# Patient Record
Sex: Female | Born: 1985 | Race: White | Hispanic: No | Marital: Single | Smoking: Former smoker
Health system: Southern US, Community
[De-identification: ages and names within clinical notes are randomized; demographics above are authoritative.]

## PROBLEM LIST (undated history)

## (undated) DIAGNOSIS — F329 Major depressive disorder, single episode, unspecified: Secondary | ICD-10-CM

## (undated) DIAGNOSIS — F32A Depression, unspecified: Secondary | ICD-10-CM

## (undated) DIAGNOSIS — K219 Gastro-esophageal reflux disease without esophagitis: Secondary | ICD-10-CM

## (undated) DIAGNOSIS — L700 Acne vulgaris: Secondary | ICD-10-CM

## (undated) DIAGNOSIS — R87629 Unspecified abnormal cytological findings in specimens from vagina: Secondary | ICD-10-CM

## (undated) DIAGNOSIS — F419 Anxiety disorder, unspecified: Secondary | ICD-10-CM

## (undated) HISTORY — DX: Unspecified abnormal cytological findings in specimens from vagina: R87.629

## (undated) HISTORY — DX: Anxiety disorder, unspecified: F41.9

## (undated) HISTORY — PX: OTHER SURGICAL HISTORY: SHX169

## (undated) HISTORY — DX: Depression, unspecified: F32.A

## (undated) HISTORY — PX: TONSILLECTOMY: SHX5217

## (undated) HISTORY — PX: CHOLECYSTECTOMY: SHX55

## (undated) HISTORY — PX: EXPLORATORY LAPAROTOMY: SUR591

## (undated) HISTORY — DX: Acne vulgaris: L70.0

## (undated) HISTORY — DX: Gastro-esophageal reflux disease without esophagitis: K21.9

---
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
## 2012-04-27 HISTORY — PX: LAPAROSCOPIC GASTRIC SLEEVE RESECTION: SHX5895

## 2017-09-06 DIAGNOSIS — J019 Acute sinusitis, unspecified: Secondary | ICD-10-CM | POA: Diagnosis not present

## 2018-06-11 ENCOUNTER — Emergency Department (HOSPITAL_COMMUNITY): Payer: Medicaid Other

## 2018-06-11 ENCOUNTER — Emergency Department (HOSPITAL_COMMUNITY)
Admission: EM | Admit: 2018-06-11 | Discharge: 2018-06-11 | Disposition: A | Discharge status: Home / Self Care | Payer: Medicaid Other | Attending: Emergency Medicine | Admitting: Emergency Medicine

## 2018-06-11 ENCOUNTER — Other Ambulatory Visit: Payer: Self-pay

## 2018-06-11 ENCOUNTER — Encounter (HOSPITAL_COMMUNITY): Payer: Self-pay | Admitting: Emergency Medicine

## 2018-06-11 DIAGNOSIS — Y999 Unspecified external cause status: Secondary | ICD-10-CM | POA: Insufficient documentation

## 2018-06-11 DIAGNOSIS — S8262XA Displaced fracture of lateral malleolus of left fibula, initial encounter for closed fracture: Secondary | ICD-10-CM | POA: Diagnosis not present

## 2018-06-11 DIAGNOSIS — Y929 Unspecified place or not applicable: Secondary | ICD-10-CM | POA: Diagnosis not present

## 2018-06-11 DIAGNOSIS — S99922A Unspecified injury of left foot, initial encounter: Secondary | ICD-10-CM | POA: Diagnosis not present

## 2018-06-11 DIAGNOSIS — W1843XA Slipping, tripping and stumbling without falling due to stepping from one level to another, initial encounter: Secondary | ICD-10-CM | POA: Diagnosis not present

## 2018-06-11 DIAGNOSIS — M7989 Other specified soft tissue disorders: Secondary | ICD-10-CM | POA: Diagnosis not present

## 2018-06-11 DIAGNOSIS — S99912A Unspecified injury of left ankle, initial encounter: Secondary | ICD-10-CM | POA: Diagnosis not present

## 2018-06-11 DIAGNOSIS — Y939 Activity, unspecified: Secondary | ICD-10-CM | POA: Insufficient documentation

## 2018-06-11 MED ORDER — NAPROXEN 250 MG PO TABS
500.0000 mg | ORAL_TABLET | Freq: Once | ORAL | Status: AC
  Administered 2018-06-11: 500 mg via ORAL
  Filled 2018-06-11: qty 2

## 2018-06-11 NOTE — ED Triage Notes (Signed)
Pt states she jumped off of a dresser and landed twisting her left ankle. Cannot bear weight. States she can move it around.

## 2018-06-11 NOTE — Discharge Instructions (Signed)
You were seen in the ER for ankle pain.  X-ray shows a tiny, minimally displaced fracture of the lateral malleolus.  This can be treated nonoperatively until orthopedist evaluates you.  Wear your walking boot until you are evaluated for further recommendations of management.  Rest, ice, elevate your foot.  You may take ibuprofen or acetaminophen as needed for pain.  Return to the ER if there is discoloration to your toes, loss of sensation or tingling to your toes, calf pain or swelling.

## 2018-06-11 NOTE — ED Provider Notes (Signed)
[facility] EMERGENCY DEPARTMENT Provider Note   CSN: [id number] Arrival date & time: 06/11/18  0126     History   Chief Complaint Chief Complaint  Patient presents with  . Ankle Injury    HPI Anna Blake is a 33 y.o. female is here for evaluation of left ankle pain.  Sudden onset when she jumped from a dresser down to the ground level.  She felt her ankle rolled inward.  Associated with mild local swelling.  She is able to move the ankle but the pain is worse with weightbearing.  She denies any previous injuries or surgeries to the joint.  She denies any redness, warmth, calf pain or swelling.  She has been icing the ankle in the ER but not interventions.  No alleviating factors.  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   HPI  History reviewed. No pertinent past medical history.  There are no active problems to display for this patient.   History reviewed. No pertinent surgical history.   OB History   No obstetric history on file.      Home Medications    Prior to Admission medications   Not on File    Family History No family history on file.  Social History Social History   Tobacco Use  . Smoking status: Never Smoker  . Smokeless tobacco: Never Used  Substance Use Topics  . Alcohol use: Never    Frequency: Never  . Drug use: Never     Allergies   Patient has no allergy information on record.   Review of Systems Review of Systems  Musculoskeletal: Positive for arthralgias.  All other systems reviewed and are negative.    Physical Exam Updated Vital Signs BP 122/88 (BP Location: Right Arm)   Pulse (!) 109   Temp 98.7 F (37.1 C) (Oral)   Resp 18   LMP 06/01/2018   SpO2 100%   Physical Exam Constitutional:      Appearance: She is well-developed. She is not toxic-appearing.  HENT:     Head: Normocephalic.     Right Ear: External ear normal.     Left Ear: External ear normal.     Nose: Nose normal.  Eyes:   Conjunctiva/sclera: Conjunctivae normal.  Neck:     Musculoskeletal: Full passive range of motion without pain.  Cardiovascular:     Rate and Rhythm: Normal rate.     Pulses:          Dorsalis pedis pulses are 1+ on the right side and 1+ on the left side.     Comments: No calf tenderness or edema Pulmonary:     Effort: Pulmonary effort is normal. No tachypnea or respiratory distress.  Musculoskeletal: Normal range of motion.        General: Swelling and tenderness present.     Left ankle: She exhibits swelling and ecchymosis. Tenderness.     Comments: Left ankle: mild edema, ecchymosis to area inferior to lateral malleolus.  Focal bony tenderness to very distal end of lateral malleolus.  Full ROM of ankle pain with plantar flexion and inversion. +Talar tilt.  Achilles tendon is non tender. Negative anterior and posterior drawer. Negative syndesmosis squeeze test. Negative Thompson test.   Skin:    General: Skin is warm and dry.     Capillary Refill: Capillary refill takes less than 2 seconds.  Neurological:     Mental Status: She is alert and oriented to person, place, and time.  Comments: Sensation to light touch intact to left foot medially and laterally. 5/5 strength with ankle flexion/extension against resistance.   Psychiatric:        Behavior: Behavior normal.        Thought Content: Thought content normal.      ED Treatments / Results  Labs (all labs ordered are listed, but only abnormal results are displayed) Labs Reviewed - No data to display  EKG None  Radiology Dg Ankle Complete Left  Result Date: 06/11/2018 CLINICAL DATA:  Initial evaluation for acute trauma, fall with twisting injury. EXAM: LEFT ANKLE COMPLETE - 3+ VIEW COMPARISON:  None. FINDINGS: Small osseous fragment at the distal aspect of the lateral malleolus, somewhat age indeterminate, but suspicious for possible acute minimally displaced fracture. Few adjacent tiny corticated osseous densities favored  to be chronic in nature. Overlying soft tissue swelling. Ankle mortise approximated. Small posterior calcaneal enthesophyte noted. IMPRESSION: 1. Small osseous fragment at the distal aspect of the lateral malleolus, somewhat age indeterminate, but suspicious for possible acute minimally displaced fracture. 2. Overlying soft tissue swelling at the lateral malleolus. Electronically Signed   By: Rise Mu M.D.   On: 06/11/2018 02:35   Dg Foot Complete Left  Result Date: 06/11/2018 CLINICAL DATA:  Initial evaluation for acute trauma, fall with twisting injury. EXAM: LEFT FOOT - COMPLETE 3+ VIEW COMPARISON:  None. FINDINGS: There is no evidence of fracture or dislocation. There is no evidence of arthropathy or other focal bone abnormality. Soft tissues are unremarkable. IMPRESSION: Negative. Electronically Signed   By: Rise Mu M.D.   On: 06/11/2018 02:33    Procedures Procedures (including critical care time)  Medications Ordered in ED Medications  naproxen (NAPROSYN) tablet 500 mg (500 mg Oral Given 06/11/18 3382)     Initial Impression / Assessment and Plan / ED Course  I have reviewed the triage vital signs and the nursing notes.  Pertinent labs & imaging results that were available during my care of the patient were reviewed by me and considered in my medical decision making (see chart for details).  Clinical Course as of Jun 11 698  Sat Jun 11, 2018  0621 IMPRESSION: 1. Small osseous fragment at the distal aspect of the lateral malleolus, somewhat age indeterminate, but suspicious for possible acute minimally displaced fracture. 2. Overlying soft tissue swelling at the lateral malleolus.  DG Ankle Complete Left [CG]    Clinical Course User Index [CG] Liberty Handy, PA-C    33 year old with traumatic left lateral ankle pain.  X-ray shows small fragment of the distal lateral malleolus consistent with likely acute minimally displaced fracture.  This fits  history and exam.  Extremities neurovascularly intact.  Exam is not consistent with high ankle injury/sprain.  I discussed results with patient.  I feel like this injury can be stabilized with a cam walker.  I will discharge with rice protocol and follow-up with orthopedist.  Return precautions were discussed.  She is in agreement.  Final Clinical Impressions(s) / ED Diagnoses   Final diagnoses:  Closed displaced fracture of lateral malleolus of left fibula, initial encounter    ED Discharge Orders    None       Liberty Handy, PA-C 06/11/18 0700    Gilda Crease, MD 06/12/18 [phone]

## 2018-06-11 NOTE — ED Notes (Signed)
E-signature not available verbalized understanding of DC instructions.

## 2018-10-17 DIAGNOSIS — H5213 Myopia, bilateral: Secondary | ICD-10-CM | POA: Diagnosis not present

## 2018-10-24 ENCOUNTER — Encounter: Payer: Self-pay | Admitting: Osteopathic Medicine

## 2018-10-24 ENCOUNTER — Ambulatory Visit (INDEPENDENT_AMBULATORY_CARE_PROVIDER_SITE_OTHER): Payer: 59 | Admitting: Osteopathic Medicine

## 2018-10-24 VITALS — BP 129/77 | HR 108 | Temp 99.5°F | Ht 62.0 in | Wt 184.5 lb

## 2018-10-24 DIAGNOSIS — Z9049 Acquired absence of other specified parts of digestive tract: Secondary | ICD-10-CM

## 2018-10-24 DIAGNOSIS — R635 Abnormal weight gain: Secondary | ICD-10-CM | POA: Diagnosis not present

## 2018-10-24 DIAGNOSIS — Z9089 Acquired absence of other organs: Secondary | ICD-10-CM | POA: Diagnosis not present

## 2018-10-24 DIAGNOSIS — F419 Anxiety disorder, unspecified: Secondary | ICD-10-CM

## 2018-10-24 DIAGNOSIS — Z8742 Personal history of other diseases of the female genital tract: Secondary | ICD-10-CM

## 2018-10-24 DIAGNOSIS — Z9884 Bariatric surgery status: Secondary | ICD-10-CM | POA: Diagnosis not present

## 2018-10-24 DIAGNOSIS — Z975 Presence of (intrauterine) contraceptive device: Secondary | ICD-10-CM | POA: Diagnosis not present

## 2018-10-24 DIAGNOSIS — Z87891 Personal history of nicotine dependence: Secondary | ICD-10-CM

## 2018-10-24 DIAGNOSIS — K219 Gastro-esophageal reflux disease without esophagitis: Secondary | ICD-10-CM

## 2018-10-24 DIAGNOSIS — F329 Major depressive disorder, single episode, unspecified: Secondary | ICD-10-CM | POA: Diagnosis not present

## 2018-10-24 MED ORDER — PANTOPRAZOLE SODIUM 40 MG PO TBEC: 40.0000 mg | DELAYED_RELEASE_TABLET | Freq: Two times a day (BID) | ORAL | 0 refills | Status: DC

## 2018-10-24 MED ORDER — PHENTERMINE HCL 37.5 MG PO CAPS: 37.5000 mg | ORAL_CAPSULE | ORAL | 0 refills | Status: DC

## 2018-10-24 NOTE — Progress Notes (Signed)
HPI: Anna Blake is a 33 y.o. female who  has no past medical history on file.  she presents to [facility] today, 10/24/18,  for chief complaint of: New to establish  Acid reflux   Concern for worsening GERD, omeprazole doesn't seem to be helping.   Concerned about weight gain worse in past couple months. OBGYN would intermittently Rx phentermine but she seems to gain the weight back anytime she comes off this meidcaiton.   Takes spironolactone for cystic acne, thinking of coming off this medicine  Takes Prozac and Wellbutrin       Past medical, surgical, social and family history reviewed:  Patient Active Problem List   Diagnosis Date Noted  . IUD (intrauterine device) in place 10/26/2018  . Former tobacco use 10/26/2018  . History of cholecystectomy 10/26/2018  . History of tonsillectomy 10/26/2018  . History of bariatric surgery 10/26/2018  . History of abnormal cervical Pap smear 10/26/2018  . Anxiety and depression 10/26/2018  . Gastroesophageal reflux disease without esophagitis 10/26/2018  . Abnormal weight gain 10/26/2018   Past Surgical History:  Procedure Laterality Date  . CESAREAN SECTION    . CHOLECYSTECTOMY    . LAPAROSCOPIC GASTRIC SLEEVE RESECTION    . TONSILLECTOMY      Social History   Tobacco Use  . Smoking status: Former Games developermoker  . Smokeless tobacco: Never Used  Substance Use Topics  . Alcohol use: Never    Frequency: Never    History reviewed. No pertinent family history.      Current medication list and allergy/intolerance information reviewed:    Current Outpatient Medications  Medication Sig Dispense Refill  . B Complex-Biotin-FA (B-COMPLEX PO) Take by mouth.    Marland Kitchen. buPROPion (WELLBUTRIN XL) 150 MG 24 hr tablet TAKE 2 TABLETS BY MOUTH IN THE MORNING    . Docusate Sodium 100 MG capsule Take 100 mg by mouth 2 (two) times daily.    Marland Kitchen. FLUoxetine (PROZAC) 40 MG capsule Take 40 mg by mouth daily.    Marland Kitchen.  omeprazole (PRILOSEC) 20 MG capsule Take 40 mg by mouth 2 (two) times daily.    Marland Kitchen. senna-docusate (SENOKOT-S) 8.6-50 MG tablet Take 1 tablet by mouth daily.    Marland Kitchen. spironolactone (ALDACTONE) 100 MG tablet Take 100 mg by mouth 2 (two) times daily.    . pantoprazole (PROTONIX) 40 MG tablet Take 1 tablet (40 mg total) by mouth 2 (two) times daily. 180 tablet 0  . phentermine 37.5 MG capsule Take 1 capsule (37.5 mg total) by mouth every morning. 30 capsule 0   No current facility-administered medications for this visit.     Allergies  Allergen Reactions  . Z-Pak [Azithromycin] Hives      Review of Systems:  Constitutional:  No  fever, no chills, No recent illness, No unintentional weight changes. No significant fatigue.   HEENT: No  headache, no vision change, no hearing change, No sore throat, No  sinus pressure  Cardiac: No  chest pain, No  pressure, No palpitations, No  Orthopnea  Respiratory:  No  shortness of breath. No  Cough  Gastrointestinal: No  abdominal pain, No  nausea, No  vomiting,  No  blood in stool, No  diarrhea, No  Constipation, +GERD  Musculoskeletal: No new myalgia/arthralgia  Skin: No  Rash, No other wounds/concerning lesions  Genitourinary: No  incontinence, No  abnormal genital bleeding, No abnormal genital discharge  Hem/Onc: No  easy bruising/bleeding  Endocrine: No cold intolerance,  No heat intolerance. No polyuria/polydipsia/polyphagia   Neurologic: No  weakness, No  dizziness, No  slurred speech/focal weakness/facial droop  Psychiatric: +concerns with depression, +concerns with anxiety, +sleep problems, No mood problems  Exam:  BP 129/77 (BP Location: Left Arm, Patient Position: Sitting, Cuff Size: Normal)   Pulse (!) 108   Temp 99.5 F (37.5 C) (Oral)   Ht 5\' 2"  (1.575 m)   Wt 184 lb 8 oz (83.7 kg)   BMI 33.75 kg/m   Constitutional: VS see above. General Appearance: alert, well-developed, well-nourished, NAD  Eyes: Normal lids and  conjunctive, non-icteric sclera  Neck: No masses, trachea midline. No thyroid enlargement. No tenderness/mass appreciated. No lymphadenopathy  Respiratory: Normal respiratory effort. no wheeze, no rhonchi, no rales  Cardiovascular: S1/S2 normal, no murmur, no rub/gallop auscultated. RRR. No lower extremity edema.  Gastrointestinal: Nontender, no masses. No hepatomegaly, no splenomegaly. No hernia appreciated. Bowel sounds normal. Rectal exam deferred.   Musculoskeletal: Gait normal. No clubbing/cyanosis of digits.   Neurological: Normal balance/coordination. No tremor. No cranial nerve deficit on limited exam. Motor and sensation intact and symmetric. Cerebellar reflexes intact.   Skin: warm, dry, intact. No rash/ulcer. No concerning nevi or subq nodules on limited exam.    Psychiatric: Normal judgment/insight. Normal mood and affect. Oriented x3.            ASSESSMENT/PLAN: The primary encounter diagnosis was Gastroesophageal reflux disease without esophagitis. Diagnoses of Anxiety and depression, History of abnormal cervical Pap smear, History of bariatric surgery, History of tonsillectomy, History of cholecystectomy, Former tobacco use, IUD (intrauterine device) in place, and Abnormal weight gain were also pertinent to this visit.   Orders Placed This Encounter  Procedures  . Helicobacter pylori special antigen    Meds ordered this encounter  Medications  . pantoprazole (PROTONIX) 40 MG tablet    Sig: Take 1 tablet (40 mg total) by mouth 2 (two) times daily.    Dispense:  180 tablet    Refill:  0  . phentermine 37.5 MG capsule    Sig: Take 1 capsule (37.5 mg total) by mouth every morning.    Dispense:  30 capsule    Refill:  0    Pt requested capsule, but tablets are ok if these are significantly cheaper    Patient Instructions  Plan:  Acid reflux: Will change antacid medications to pantoprazole, hold the omeprazole. Will test for H/ Pylori bacteria which may be  contributing to symptoms, and if test is positive will treat accordingly. If negative and/or symptoms persist, will refer to GI.   Weight: OK to refill phentermine. Please return to clinic for nurse visit weight/BP check in 4 weeks to maintain Rx.  Medications approved for long-term use for obesity  Qsymia (Phentermine and Topiramate)  Saxenda (Liraglutide daily), Ozempic (Semaglutide weekly)  Contrave (Bupropion and Naltrexone)  Orlistat (Xenical, Alli)  Bupropion (Wellbutrin) I recommend that you research the above medications and see which one(s) your insurance may or may not cover: If you call your insurance, ask them specifically what medications are on their formulary that are approved for obesity treatment. They should be able to send you a list or tell you over the phone. Remember, medications aren't magic! You MUST be diligent about lifestyle changes as well!   Await records re: previous labs, etc         Visit summary with medication list and pertinent instructions was printed for patient to review. All questions at time of visit were answered - patient instructed  to contact office with any additional concerns or updates. ER/RTC precautions were reviewed with the patient.     Please note: voice recognition software was used to produce this document, and typos may escape review. Please contact Dr. Lyn HollingsheadAlexander for any needed clarifications.     Follow-up plan: Return in about 4 weeks (around 11/21/2018) for BP/weight check (phentermine), recheck acid reflux. See me sooner if worse/change! .Marland Kitchen

## 2018-10-24 NOTE — Patient Instructions (Addendum)
Plan:  Acid reflux: Will change antacid medications to pantoprazole, hold the omeprazole. Will test for H/ Pylori bacteria which may be contributing to symptoms, and if test is positive will treat accordingly. If negative and/or symptoms persist, will refer to GI.   Weight: OK to refill phentermine. Please return to clinic for nurse visit weight/BP check in 4 weeks to maintain Rx.  Medications approved for long-term use for obesity  Qsymia (Phentermine and Topiramate)  Saxenda (Liraglutide daily), Ozempic (Semaglutide weekly)  Contrave (Bupropion and Naltrexone)  Orlistat (Xenical, Alli)  Bupropion (Wellbutrin) I recommend that you research the above medications and see which one(s) your insurance may or may not cover: If you call your insurance, ask them specifically what medications are on their formulary that are approved for obesity treatment. They should be able to send you a list or tell you over the phone. Remember, medications aren't magic! You MUST be diligent about lifestyle changes as well!   Await records re: previous labs, etc

## 2018-10-26 DIAGNOSIS — R635 Abnormal weight gain: Secondary | ICD-10-CM | POA: Insufficient documentation

## 2018-10-26 DIAGNOSIS — Z9884 Bariatric surgery status: Secondary | ICD-10-CM | POA: Insufficient documentation

## 2018-10-26 DIAGNOSIS — F419 Anxiety disorder, unspecified: Secondary | ICD-10-CM | POA: Insufficient documentation

## 2018-10-26 DIAGNOSIS — F329 Major depressive disorder, single episode, unspecified: Secondary | ICD-10-CM | POA: Insufficient documentation

## 2018-10-26 DIAGNOSIS — Z975 Presence of (intrauterine) contraceptive device: Secondary | ICD-10-CM | POA: Insufficient documentation

## 2018-10-26 DIAGNOSIS — Z8742 Personal history of other diseases of the female genital tract: Secondary | ICD-10-CM | POA: Insufficient documentation

## 2018-10-26 DIAGNOSIS — Z87891 Personal history of nicotine dependence: Secondary | ICD-10-CM | POA: Insufficient documentation

## 2018-10-26 DIAGNOSIS — Z9089 Acquired absence of other organs: Secondary | ICD-10-CM | POA: Insufficient documentation

## 2018-10-26 DIAGNOSIS — K219 Gastro-esophageal reflux disease without esophagitis: Secondary | ICD-10-CM | POA: Insufficient documentation

## 2018-10-26 DIAGNOSIS — Z9049 Acquired absence of other specified parts of digestive tract: Secondary | ICD-10-CM | POA: Insufficient documentation

## 2018-11-03 LAB — HELICOBACTER PYLORI  SPECIAL ANTIGEN
MICRO NUMBER:: 637978
SPECIMEN QUALITY: ADEQUATE

## 2018-11-21 ENCOUNTER — Ambulatory Visit: Payer: 59 | Admitting: Osteopathic Medicine

## 2018-11-23 ENCOUNTER — Ambulatory Visit: Payer: 59 | Admitting: Osteopathic Medicine

## 2018-12-20 DIAGNOSIS — M67432 Ganglion, left wrist: Secondary | ICD-10-CM | POA: Diagnosis not present

## 2018-12-20 DIAGNOSIS — M25532 Pain in left wrist: Secondary | ICD-10-CM | POA: Diagnosis not present

## 2019-01-16 DIAGNOSIS — G5603 Carpal tunnel syndrome, bilateral upper limbs: Secondary | ICD-10-CM | POA: Diagnosis not present

## 2019-01-23 DIAGNOSIS — M67432 Ganglion, left wrist: Secondary | ICD-10-CM | POA: Diagnosis not present

## 2019-01-23 DIAGNOSIS — M25532 Pain in left wrist: Secondary | ICD-10-CM | POA: Diagnosis not present

## 2019-01-23 DIAGNOSIS — G5603 Carpal tunnel syndrome, bilateral upper limbs: Secondary | ICD-10-CM | POA: Diagnosis not present

## 2019-02-03 DIAGNOSIS — G5603 Carpal tunnel syndrome, bilateral upper limbs: Secondary | ICD-10-CM | POA: Diagnosis not present

## 2019-02-15 ENCOUNTER — Other Ambulatory Visit: Payer: Self-pay | Admitting: Osteopathic Medicine

## 2019-02-16 DIAGNOSIS — M25532 Pain in left wrist: Secondary | ICD-10-CM | POA: Diagnosis not present

## 2019-02-16 DIAGNOSIS — M79641 Pain in right hand: Secondary | ICD-10-CM | POA: Diagnosis not present

## 2019-03-14 ENCOUNTER — Other Ambulatory Visit: Payer: Self-pay

## 2019-03-15 ENCOUNTER — Encounter: Payer: Self-pay | Admitting: Family Medicine

## 2019-03-15 ENCOUNTER — Ambulatory Visit (INDEPENDENT_AMBULATORY_CARE_PROVIDER_SITE_OTHER): Payer: 59 | Admitting: Family Medicine

## 2019-03-15 VITALS — BP 123/82 | HR 98 | Temp 98.2°F | Resp 20 | Ht 62.0 in | Wt 199.0 lb

## 2019-03-15 DIAGNOSIS — L700 Acne vulgaris: Secondary | ICD-10-CM | POA: Diagnosis not present

## 2019-03-15 DIAGNOSIS — Z6836 Body mass index (BMI) 36.0-36.9, adult: Secondary | ICD-10-CM | POA: Diagnosis not present

## 2019-03-15 DIAGNOSIS — F339 Major depressive disorder, recurrent, unspecified: Secondary | ICD-10-CM | POA: Diagnosis not present

## 2019-03-15 DIAGNOSIS — K219 Gastro-esophageal reflux disease without esophagitis: Secondary | ICD-10-CM | POA: Diagnosis not present

## 2019-03-15 DIAGNOSIS — F411 Generalized anxiety disorder: Secondary | ICD-10-CM

## 2019-03-15 MED ORDER — BUPROPION HCL ER (XL) 150 MG PO TB24: ORAL_TABLET | ORAL | 5 refills | Status: DC

## 2019-03-15 MED ORDER — PANTOPRAZOLE SODIUM 40 MG PO TBEC: 40.0000 mg | DELAYED_RELEASE_TABLET | Freq: Two times a day (BID) | ORAL | 5 refills | Status: DC

## 2019-03-15 MED ORDER — FLUOXETINE HCL 40 MG PO CAPS: 40.0000 mg | ORAL_CAPSULE | Freq: Every day | ORAL | 5 refills | Status: DC

## 2019-03-15 MED ORDER — CLINDAMYCIN PHOS-BENZOYL PEROX 1-5 % EX GEL: Freq: Two times a day (BID) | CUTANEOUS | 0 refills | Status: DC

## 2019-03-15 NOTE — Patient Instructions (Signed)
It was a pleasure seeing you today, Anna Blake.  Information regarding what we discussed is included in this packet.  Please make an appointment to see me in 6 months.    If you had labs performed today, you will be contacted with the abnormal results once they are available, usually in the next 3 business days for routine lab work.  If you had STI testing, a pap smear, or a biopsy performed, expect to be contacted in about 7-10 days. If results are normal, you will not be notified.    In a few days you may receive a survey in the mail or online from [employer] regarding your visit with [name] today. Please take a moment to fill this out. Your feedback is very important to our office. It can help [name] better understand your needs as well as improve your experience and satisfaction. Thank you for taking your time to complete it. We care about you.  Because of recent events of COVID-19 ("Coronavirus"), please follow CDC recommendations:   1. Wash your hand frequently 2. Avoid touching your face 3. Stay away from people who are sick 4. If you have symptoms such as fever, cough, shortness of breath then call your healthcare provider for further guidance 5. If you are sick, STAY AT HOME, unless otherwise directed by your healthcare provider. 6. Follow directions from state and national officials regarding staying safe    Please feel free to call our office if any questions or concerns arise.  Warm Regards, Monia Pouch, FNP-C [facility] [address] [place], [place] [address] [phone]

## 2019-03-15 NOTE — Progress Notes (Signed)
Subjective:  Patient ID: Anna Blake, female    DOB: 1985/07/31, 33 y.o.   MRN: [id number]  Patient Care Team: Baruch Gouty, FNP as PCP - General (Family Medicine)   Chief Complaint:  Establish Care (NEW)   HPI: Anna Blake is a 33 y.o. female presenting on 03/15/2019 for Establish Care (NEW)  Pt presents today to establish care. Pt recently moved here from [place]. Pt states she is doing fairly well overall. She has noticed an increased in her weight over the last several weeks but she has been working from home due to the pandemic and is recovering from bilateral carpal tunnel surgery. Pt states she she has healed well from the surgery. States she had a gastric sleeve in 2014 and did well. States she does have GERD from the surgery but manages well with Protonix and break through antiacids as needed. She is currently on medications for anxiety, depression, and OCD. States she has been on these for several years and is very well controlled with them. Denies adverse side effects from the medications. She does report problematic acne. States this has become worse over the last several years. States her GYN had her on spironolactone. States this does not seem to be beneficial. She has tried topical regimens without relief but has not done this in a while.  She reports recently changing her diet and started exercising. States she is eating more salads and grilled proteins. States she is doing well sticking to the diet.    Relevant past medical, surgical, family, and social history reviewed and updated as indicated.  Allergies and medications reviewed and updated. Date reviewed: Chart in Epic.   Past Medical History:  Diagnosis Date  . Abnormal vaginal Pap smear   . Anxiety   . Cystic acne   . Depression   . GERD (gastroesophageal reflux disease)     Past Surgical History:  Procedure Laterality Date  . carpel tunnel; release 02/03/19 Dr Amedeo Plenty  Bilateral   . CESAREAN SECTION     . CHOLECYSTECTOMY    . LAPAROSCOPIC GASTRIC SLEEVE RESECTION    . TONSILLECTOMY      Social History   Socioeconomic History  . Marital status: Single    Spouse name: Not on file  . Number of children: 1  . Years of education: Not on file  . Highest education level: Not on file  Occupational History  . Not on file  Social Needs  . Financial resource strain: Not on file  . Food insecurity    Worry: Not on file    Inability: Not on file  . Transportation needs    Medical: Not on file    Non-medical: Not on file  Tobacco Use  . Smoking status: Former Research scientist (life sciences)  . Smokeless tobacco: Never Used  Substance and Sexual Activity  . Alcohol use: Never    Frequency: Never    Comment: social   . Drug use: Never  . Sexual activity: Yes    Partners: Male  Lifestyle  . Physical activity    Days per week: Not on file    Minutes per session: Not on file  . Stress: Not on file  Relationships  . Social Herbalist on phone: Not on file    Gets together: Not on file    Attends religious service: Not on file    Active member of club or organization: Not on file    Attends meetings of clubs or  organizations: Not on file    Relationship status: Not on file  . Intimate partner violence    Fear of current or ex partner: Not on file    Emotionally abused: Not on file    Physically abused: Not on file    Forced sexual activity: Not on file  Other Topics Concern  . Not on file  Social History Narrative  . Not on file    Outpatient Encounter Medications as of 03/15/2019  Medication Sig  . B Complex-Biotin-FA (B-COMPLEX PO) Take by mouth.  . Biotin 1000 MCG tablet Take 1,000 mcg by mouth 3 (three) times daily.  Marland Kitchen buPROPion (WELLBUTRIN XL) 150 MG 24 hr tablet TAKE 2 TABLETS BY MOUTH IN THE MORNING  . FLUoxetine (PROZAC) 40 MG capsule Take 1 capsule (40 mg total) by mouth daily.  . pantoprazole (PROTONIX) 40 MG tablet Take 1 tablet (40 mg total) by mouth 2 (two) times daily.  Marland Kitchen  senna-docusate (SENOKOT-S) 8.6-50 MG tablet Take 1 tablet by mouth daily.  . [DISCONTINUED] buPROPion (WELLBUTRIN XL) 150 MG 24 hr tablet TAKE 2 TABLETS BY MOUTH IN THE MORNING  . [DISCONTINUED] FLUoxetine (PROZAC) 40 MG capsule Take 40 mg by mouth daily.  . [DISCONTINUED] pantoprazole (PROTONIX) 40 MG tablet Take 1 tablet by mouth twice daily  . [DISCONTINUED] spironolactone (ALDACTONE) 100 MG tablet Take 100 mg by mouth 2 (two) times daily.  . clindamycin-benzoyl peroxide (BENZACLIN) gel Apply topically 2 (two) times daily.  . [DISCONTINUED] Docusate Sodium 100 MG capsule Take 100 mg by mouth 2 (two) times daily.  . [DISCONTINUED] omeprazole (PRILOSEC) 20 MG capsule Take 40 mg by mouth 2 (two) times daily.  . [DISCONTINUED] phentermine 37.5 MG capsule Take 1 capsule (37.5 mg total) by mouth every morning.   No facility-administered encounter medications on file as of 03/15/2019.     Allergies  Allergen Reactions  . Z-Pak [Azithromycin] Hives    Review of Systems  Constitutional: Positive for unexpected weight change. Negative for activity change, appetite change, chills, diaphoresis, fatigue and fever.  HENT: Negative.   Eyes: Negative.  Negative for photophobia and visual disturbance.  Respiratory: Negative for cough, chest tightness and shortness of breath.   Cardiovascular: Negative for chest pain, palpitations and leg swelling.  Gastrointestinal: Negative for abdominal pain, blood in stool, constipation, diarrhea, nausea and vomiting.  Endocrine: Negative.  Negative for cold intolerance, heat intolerance, polydipsia, polyphagia and polyuria.  Genitourinary: Negative for decreased urine volume, difficulty urinating, dysuria, frequency and urgency.  Musculoskeletal: Negative for arthralgias and myalgias.  Skin:       Cystic acne  Allergic/Immunologic: Negative.   Neurological: Negative for dizziness, tremors, seizures, syncope, facial asymmetry, speech difficulty, weakness,  light-headedness, numbness and headaches.  Hematological: Negative.   Psychiatric/Behavioral: Negative for confusion, hallucinations, sleep disturbance and suicidal ideas.  All other systems reviewed and are negative.       Objective:  BP 123/82   Pulse 98   Temp 98.2 F (36.8 C)   Resp 20   Ht _0  (1.575 m)   Wt 199 lb (90.3 kg)   SpO2 98%   BMI 36.40 kg/m    Wt Readings from Last 3 Encounters:  03/15/19 199 lb (90.3 kg)  10/24/18 184 lb 8 oz (83.7 kg)    Physical Exam Vitals signs and nursing note reviewed.  Constitutional:      General: She is not in acute distress.    Appearance: Normal appearance. She is well-developed and well-groomed. She is obese.  She is not ill-appearing, toxic-appearing or diaphoretic.  HENT:     Head: Normocephalic and atraumatic.     Jaw: There is normal jaw occlusion.     Right Ear: Hearing, tympanic membrane, ear canal and external ear normal.     Left Ear: Hearing, tympanic membrane, ear canal and external ear normal.     Nose: Nose normal.     Mouth/Throat:     Lips: Pink.     Mouth: Mucous membranes are moist.     Pharynx: Oropharynx is clear. Uvula midline. No oropharyngeal exudate or posterior oropharyngeal erythema.  Eyes:     General: Lids are normal.     Extraocular Movements: Extraocular movements intact.     Conjunctiva/sclera: Conjunctivae normal.     Pupils: Pupils are equal, round, and reactive to light.  Neck:     Musculoskeletal: Normal range of motion and neck supple.     Thyroid: No thyroid mass, thyromegaly or thyroid tenderness.     Vascular: No carotid bruit or JVD.     Trachea: Trachea and phonation normal.  Cardiovascular:     Rate and Rhythm: Normal rate and regular rhythm.     Chest Wall: PMI is not displaced.     Pulses: Normal pulses.     Heart sounds: Normal heart sounds. No murmur. No friction rub. No gallop.   Pulmonary:     Effort: Pulmonary effort is normal. No respiratory distress.     Breath  sounds: Normal breath sounds. No wheezing.  Abdominal:     General: Bowel sounds are normal. There is no distension or abdominal bruit.     Palpations: Abdomen is soft. There is no hepatomegaly or splenomegaly.     Tenderness: There is no abdominal tenderness. There is no right CVA tenderness or left CVA tenderness.     Hernia: No hernia is present.  Musculoskeletal: Normal range of motion.     Right lower leg: No edema.     Left lower leg: No edema.  Lymphadenopathy:     Cervical: No cervical adenopathy.  Skin:    General: Skin is warm and dry.     Capillary Refill: Capillary refill takes less than 2 seconds.     Coloration: Skin is not cyanotic, jaundiced or pale.     Findings: Acne (cystic acne to cheeks and chin) present. No rash.  Neurological:     General: No focal deficit present.     Mental Status: She is alert and oriented to person, place, and time.     Cranial Nerves: Cranial nerves are intact. No cranial nerve deficit.     Sensory: Sensation is intact. No sensory deficit.     Motor: Motor function is intact. No weakness.     Coordination: Coordination is intact. Coordination normal.     Gait: Gait is intact. Gait normal.     Deep Tendon Reflexes: Reflexes are normal and symmetric. Reflexes normal.  Psychiatric:        Attention and Perception: Attention and perception normal.        Mood and Affect: Mood and affect normal.        Speech: Speech normal.        Behavior: Behavior normal. Behavior is cooperative.        Thought Content: Thought content normal.        Cognition and Memory: Cognition and memory normal.        Judgment: Judgment normal.     Results for orders placed or  performed in visit on 40/08/67  Helicobacter pylori special antigen  Result Value Ref Range   MICRO NUMBER: [id number]    SPECIMEN QUALITY Adequate    SOURCE: STOOL    STATUS: FINAL    RESULT:      Not Detected  Antimicrobials, proton pump inhibitors, and bismuth preparations inhibit H.  pylori and ingestion up to two weeks prior to testing may cause false negative results. If clinically indicated the test should be repeated on a new specimen  obtained two weeks after discontinuing treatment.        Pertinent labs & imaging results that were available during my care of the patient were reviewed by me and considered in my medical decision making.  Assessment & Plan:  Chirsty was seen today for establish care.  Diagnoses and all orders for this visit:  Gastroesophageal reflux disease without esophagitis No red flags present. Diet discussed. Avoid fried, spicy, fatty, greasy, and acidic foods. Avoid caffeine, nicotine, and alcohol. Do not eat 2-3 hours before bedtime and stay upright for at least 1-2 hours after eating. Eat small frequent meals. Avoid NSAID's like motrin and aleve. Medications as prescribed. Report any new or worsening symptoms. Follow up as discussed or sooner if needed.   -     pantoprazole (PROTONIX) 40 MG tablet; Take 1 tablet (40 mg total) by mouth 2 (two) times daily. -     CBC with Differential/Platelet  Depression, recurrent (HCC) GAD (generalized anxiety disorder) Doing well with current regimen. Will continue below.  -     buPROPion (WELLBUTRIN XL) 150 MG 24 hr tablet; TAKE 2 TABLETS BY MOUTH IN THE MORNING -     FLUoxetine (PROZAC) 40 MG capsule; Take 1 capsule (40 mg total) by mouth daily. -     Thyroid Panel With TSH  Cystic acne vulgaris Has been on spironolactone for a while without symptom resolution. Will trial benzaclin. Face hygiene discussed. Report any new or worsening symptoms.  -     clindamycin-benzoyl peroxide (BENZACLIN) gel; Apply topically 2 (two) times daily.  BMI 36.0-36.9,adult Has changed diet and started exercising. Will check below. Recheck BMI in 3 months.  -     CMP14+EGFR -     CBC with Differential/Platelet -     Lipid panel -     Thyroid Panel With TSH     Continue all other maintenance medications.  Follow  up plan: Return if symptoms worsen or fail to improve.  Continue healthy lifestyle choices, including diet (rich in fruits, vegetables, and lean proteins, and low in salt and simple carbohydrates) and exercise (at least 30 minutes of moderate physical activity daily).  Educational handout given for survey, COVID-19  The above assessment and management plan was discussed with the patient. The patient verbalized understanding of and has agreed to the management plan. Patient is aware to call the clinic if they develop any new symptoms or if symptoms persist or worsen. Patient is aware when to return to the clinic for a follow-up visit. Patient educated on when it is appropriate to go to the emergency department.   Monia Pouch, FNP-C [facility] [phone]

## 2019-03-16 ENCOUNTER — Other Ambulatory Visit: Payer: Self-pay | Admitting: Family Medicine

## 2019-03-16 DIAGNOSIS — L700 Acne vulgaris: Secondary | ICD-10-CM

## 2019-03-16 LAB — CMP14+EGFR
ALT: 15 IU/L (ref 0–32)
AST: 20 IU/L (ref 0–40)
Albumin/Globulin Ratio: 1.7 (ref 1.2–2.2)
Albumin: 4.5 g/dL (ref 3.8–4.8)
Alkaline Phosphatase: 119 IU/L — ABNORMAL HIGH (ref 39–117)
BUN/Creatinine Ratio: 13 (ref 9–23)
BUN: 10 mg/dL (ref 6–20)
Bilirubin Total: 0.3 mg/dL (ref 0.0–1.2)
CO2: 23 mmol/L (ref 20–29)
Calcium: 9.8 mg/dL (ref 8.7–10.2)
Chloride: 101 mmol/L (ref 96–106)
Creatinine, Ser: 0.79 mg/dL (ref 0.57–1.00)
GFR calc Af Amer: 112 mL/min/{1.73_m2} (ref >59)
GFR calc non Af Amer: 97 mL/min/{1.73_m2} (ref >59)
Globulin, Total: 2.6 g/dL (ref 1.5–4.5)
Glucose: 81 mg/dL (ref 65–99)
Potassium: 4.9 mmol/L (ref 3.5–5.2)
Sodium: 138 mmol/L (ref 134–144)
Total Protein: 7.1 g/dL (ref 6.0–8.5)

## 2019-03-16 LAB — CBC WITH DIFFERENTIAL/PLATELET
Basophils Absolute: 0.1 10*3/uL (ref 0.0–0.2)
Basos: 1 %
EOS (ABSOLUTE): 0.3 10*3/uL (ref 0.0–0.4)
Eos: 3 %
Hematocrit: 42.2 % (ref 34.0–46.6)
Hemoglobin: 14 g/dL (ref 11.1–15.9)
Immature Grans (Abs): 0 10*3/uL (ref 0.0–0.1)
Immature Granulocytes: 0 %
Lymphocytes Absolute: 3 10*3/uL (ref 0.7–3.1)
Lymphs: 25 %
MCH: 30.4 pg (ref 26.6–33.0)
MCHC: 33.2 g/dL (ref 31.5–35.7)
MCV: 92 fL (ref 79–97)
Monocytes Absolute: 0.7 10*3/uL (ref 0.1–0.9)
Monocytes: 6 %
Neutrophils Absolute: 7.6 10*3/uL — ABNORMAL HIGH (ref 1.4–7.0)
Neutrophils: 65 %
Platelets: 324 10*3/uL (ref 150–450)
RBC: 4.61 x10E6/uL (ref 3.77–5.28)
RDW: 11.4 % — ABNORMAL LOW (ref 11.7–15.4)
WBC: 11.7 10*3/uL — ABNORMAL HIGH (ref 3.4–10.8)

## 2019-03-16 LAB — LIPID PANEL
Chol/HDL Ratio: 4.1 ratio (ref 0.0–4.4)
Cholesterol, Total: 218 mg/dL — ABNORMAL HIGH (ref 100–199)
HDL: 53 mg/dL (ref >39)
LDL Chol Calc (NIH): 90 mg/dL (ref 0–99)
Triglycerides: 462 mg/dL — ABNORMAL HIGH (ref 0–149)
VLDL Cholesterol Cal: 75 mg/dL — ABNORMAL HIGH (ref 5–40)

## 2019-03-16 LAB — THYROID PANEL WITH TSH
Free Thyroxine Index: 1.4 (ref 1.2–4.9)
T3 Uptake Ratio: 26 % (ref 24–39)
T4, Total: 5.5 ug/dL (ref 4.5–12.0)
TSH: 0.987 u[IU]/mL (ref 0.450–4.500)

## 2019-03-16 MED ORDER — ADAPALENE 0.1 % EX GEL: Freq: Every day | CUTANEOUS | 0 refills | Status: DC

## 2019-05-19 ENCOUNTER — Telehealth: Payer: Self-pay | Admitting: Family Medicine

## 2019-05-19 ENCOUNTER — Other Ambulatory Visit: Payer: Self-pay | Admitting: *Deleted

## 2019-05-19 ENCOUNTER — Other Ambulatory Visit: Payer: Self-pay

## 2019-05-19 DIAGNOSIS — E782 Mixed hyperlipidemia: Secondary | ICD-10-CM

## 2019-05-19 NOTE — Telephone Encounter (Signed)
Labs placed.

## 2019-05-22 ENCOUNTER — Other Ambulatory Visit: Payer: Self-pay

## 2019-05-23 ENCOUNTER — Other Ambulatory Visit: Payer: 59

## 2019-05-23 DIAGNOSIS — E782 Mixed hyperlipidemia: Secondary | ICD-10-CM

## 2019-05-24 LAB — LIPID PANEL
Chol/HDL Ratio: 3.7 ratio (ref 0.0–4.4)
Cholesterol, Total: 201 mg/dL — ABNORMAL HIGH (ref 100–199)
HDL: 54 mg/dL (ref >39)
LDL Chol Calc (NIH): 112 mg/dL — ABNORMAL HIGH (ref 0–99)
Triglycerides: 203 mg/dL — ABNORMAL HIGH (ref 0–149)
VLDL Cholesterol Cal: 35 mg/dL (ref 5–40)

## 2019-05-25 ENCOUNTER — Other Ambulatory Visit: Payer: Self-pay

## 2019-05-25 ENCOUNTER — Ambulatory Visit (INDEPENDENT_AMBULATORY_CARE_PROVIDER_SITE_OTHER): Payer: 59 | Admitting: Family Medicine

## 2019-05-25 ENCOUNTER — Encounter: Payer: Self-pay | Admitting: Family Medicine

## 2019-05-25 VITALS — BP 117/75 | HR 90 | Temp 98.7°F | Resp 20 | Ht 62.0 in | Wt 199.0 lb

## 2019-05-25 DIAGNOSIS — Z6836 Body mass index (BMI) 36.0-36.9, adult: Secondary | ICD-10-CM

## 2019-05-25 DIAGNOSIS — Z7689 Persons encountering health services in other specified circumstances: Secondary | ICD-10-CM

## 2019-05-25 DIAGNOSIS — E782 Mixed hyperlipidemia: Secondary | ICD-10-CM

## 2019-05-25 MED ORDER — SAXENDA 18 MG/3ML ~~LOC~~ SOPN: 0.6000 mg | PEN_INJECTOR | Freq: Every day | SUBCUTANEOUS | 11 refills | Status: DC

## 2019-05-25 NOTE — Patient Instructions (Signed)
Calorie Counting for Weight Loss Calories are units of energy. Your body needs a certain amount of calories from food to keep you going throughout the day. When you eat more calories than your body needs, your body stores the extra calories as fat. When you eat fewer calories than your body needs, your body burns fat to get the energy it needs. Calorie counting means keeping track of how many calories you eat and drink each day. Calorie counting can be helpful if you need to lose weight. If you make sure to eat fewer calories than your body needs, you should lose weight. Ask your health care provider what a healthy weight is for you. For calorie counting to work, you will need to eat the right number of calories in a day in order to lose a healthy amount of weight per week. A dietitian can help you determine how many calories you need in a day and will give you suggestions on how to reach your calorie goal.  A healthy amount of weight to lose per week is usually 1-2 lb (0.5-0.9 kg). This usually means that your daily calorie intake should be reduced by 500-750 calories.  Eating 1,200 - 1,500 calories per day can help most women lose weight.  Eating 1,500 - 1,800 calories per day can help most men lose weight. What is my plan? My goal is to have __________ calories per day. If I have this many calories per day, I should lose around __________ pounds per week. What do I need to know about calorie counting? In order to meet your daily calorie goal, you will need to:  Find out how many calories are in each food you would like to eat. Try to do this before you eat.  Decide how much of the food you plan to eat.  Write down what you ate and how many calories it had. Doing this is called keeping a food log. To successfully lose weight, it is important to balance calorie counting with a healthy lifestyle that includes regular activity. Aim for 150 minutes of moderate exercise (such as walking) or 75  minutes of vigorous exercise (such as running) each week. Where do I find calorie information?  The number of calories in a food can be found on a Nutrition Facts label. If a food does not have a Nutrition Facts label, try to look up the calories online or ask your dietitian for help. Remember that calories are listed per serving. If you choose to have more than one serving of a food, you will have to multiply the calories per serving by the amount of servings you plan to eat. For example, the label on a package of bread might say that a serving size is 1 slice and that there are 90 calories in a serving. If you eat 1 slice, you will have eaten 90 calories. If you eat 2 slices, you will have eaten 180 calories. How do I keep a food log? Immediately after each meal, record the following information in your food log:  What you ate. Don't forget to include toppings, sauces, and other extras on the food.  How much you ate. This can be measured in cups, ounces, or number of items.  How many calories each food and drink had.  The total number of calories in the meal. Keep your food log near you, such as in a small notebook in your pocket, or use a mobile app or website. Some programs will calculate   calories for you and show you how many calories you have left for the day to meet your goal. What are some calorie counting tips?   Use your calories on foods and drinks that will fill you up and not leave you hungry: ? Some examples of foods that fill you up are nuts and nut butters, vegetables, lean proteins, and high-fiber foods like whole grains. High-fiber foods are foods with more than 5 g fiber per serving. ? Drinks such as sodas, specialty coffee drinks, alcohol, and juices have a lot of calories, yet do not fill you up.  Eat nutritious foods and avoid empty calories. Empty calories are calories you get from foods or beverages that do not have many vitamins or protein, such as candy, sweets, and  soda. It is better to have a nutritious high-calorie food (such as an avocado) than a food with few nutrients (such as a bag of chips).  Know how many calories are in the foods you eat most often. This will help you calculate calorie counts faster.  Pay attention to calories in drinks. Low-calorie drinks include water and unsweetened drinks.  Pay attention to nutrition labels for "low fat" or "fat free" foods. These foods sometimes have the same amount of calories or more calories than the full fat versions. They also often have added sugar, starch, or salt, to make up for flavor that was removed with the fat.  Find a way of tracking calories that works for you. Get creative. Try different apps or programs if writing down calories does not work for you. What are some portion control tips?  Know how many calories are in a serving. This will help you know how many servings of a certain food you can have.  Use a measuring cup to measure serving sizes. You could also try weighing out portions on a kitchen scale. With time, you will be able to estimate serving sizes for some foods.  Take some time to put servings of different foods on your favorite plates, bowls, and cups so you know what a serving looks like.  Try not to eat straight from a bag or box. Doing this can lead to overeating. Put the amount you would like to eat in a cup or on a plate to make sure you are eating the right portion.  Use smaller plates, glasses, and bowls to prevent overeating.  Try not to multitask (for example, watch TV or use your computer) while eating. If it is time to eat, sit down at a table and enjoy your food. This will help you to know when you are full. It will also help you to be aware of what you are eating and how much you are eating. What are tips for following this plan? Reading food labels  Check the calorie count compared to the serving size. The serving size may be smaller than what you are used to  eating.  Check the source of the calories. Make sure the food you are eating is high in vitamins and protein and low in saturated and trans fats. Shopping  Read nutrition labels while you shop. This will help you make healthy decisions before you decide to purchase your food.  Make a grocery list and stick to it. Cooking  Try to cook your favorite foods in a healthier way. For example, try baking instead of frying.  Use low-fat dairy products. Meal planning  Use more fruits and vegetables. Half of your plate should be fruits   and vegetables.  Include lean proteins like poultry and fish. How do I count calories when eating out?  Ask for smaller portion sizes.  Consider sharing an entree and sides instead of getting your own entree.  If you get your own entree, eat only half. Ask for a box at the beginning of your meal and put the rest of your entree in it so you are not tempted to eat it.  If calories are listed on the menu, choose the lower calorie options.  Choose dishes that include vegetables, fruits, whole grains, low-fat dairy products, and lean protein.  Choose items that are boiled, broiled, grilled, or steamed. Stay away from items that are buttered, battered, fried, or served with cream sauce. Items labeled "crispy" are usually fried, unless stated otherwise.  Choose water, low-fat milk, unsweetened iced tea, or other drinks without added sugar. If you want an alcoholic beverage, choose a lower calorie option such as a glass of wine or light beer.  Ask for dressings, sauces, and syrups on the side. These are usually high in calories, so you should limit the amount you eat.  If you want a salad, choose a garden salad and ask for grilled meats. Avoid extra toppings like bacon, cheese, or fried items. Ask for the dressing on the side, or ask for olive oil and vinegar or lemon to use as dressing.  Estimate how many servings of a food you are given. For example, a serving of  cooked rice is  cup or about the size of half a baseball. Knowing serving sizes will help you be aware of how much food you are eating at restaurants. The list below tells you how big or small some common portion sizes are based on everyday objects: ? 1 oz--4 stacked dice. ? 3 oz--1 deck of cards. ? 1 tsp--1 die. ? 1 Tbsp-- a ping-pong ball. ? 2 Tbsp--1 ping-pong ball. ?  cup-- baseball. ? 1 cup--1 baseball. Summary  Calorie counting means keeping track of how many calories you eat and drink each day. If you eat fewer calories than your body needs, you should lose weight.  A healthy amount of weight to lose per week is usually 1-2 lb (0.5-0.9 kg). This usually means reducing your daily calorie intake by 500-750 calories.  The number of calories in a food can be found on a Nutrition Facts label. If a food does not have a Nutrition Facts label, try to look up the calories online or ask your dietitian for help.  Use your calories on foods and drinks that will fill you up, and not on foods and drinks that will leave you hungry.  Use smaller plates, glasses, and bowls to prevent overeating. This information is not intended to replace advice given to you by your health care provider. Make sure you discuss any questions you have with your health care provider. Document Revised: 12/31/2017 Document Reviewed: 03/13/2016 [employer] Patient Education  2020 [employer] Exercising to Lose Weight Exercise is structured, repetitive physical activity to improve fitness and health. Getting regular exercise is important for everyone. It is especially important if you are overweight. Being overweight increases your risk of heart disease, stroke, diabetes, high blood pressure, and several types of cancer. Reducing your calorie intake and exercising can help you lose weight. Exercise is usually categorized as moderate or vigorous intensity. To lose weight, most people need to do a certain amount of  moderate-intensity or vigorous-intensity exercise each week. Moderate-intensity exercise  Moderate-intensity exercise   is any activity that gets you moving enough to burn at least three times more energy (calories) than if you were sitting. Examples of moderate exercise include:  Walking a mile in 15 minutes.  Doing light yard work.  Biking at an easy pace. Most people should get at least 150 minutes (2 hours and 30 minutes) a week of moderate-intensity exercise to maintain their body weight. Vigorous-intensity exercise Vigorous-intensity exercise is any activity that gets you moving enough to burn at least six times more calories than if you were sitting. When you exercise at this intensity, you should be working hard enough that you are not able to carry on a conversation. Examples of vigorous exercise include:  Running.  Playing a team sport, such as football, basketball, and soccer.  Jumping rope. Most people should get at least 75 minutes (1 hour and 15 minutes) a week of vigorous-intensity exercise to maintain their body weight. How can exercise affect me? When you exercise enough to burn more calories than you eat, you lose weight. Exercise also reduces body fat and builds muscle. The more muscle you have, the more calories you burn. Exercise also:  Improves mood.  Reduces stress and tension.  Improves your overall fitness, flexibility, and endurance.  Increases bone strength. The amount of exercise you need to lose weight depends on:  Your age.  The type of exercise.  Any health conditions you have.  Your overall physical ability. Talk to your health care provider about how much exercise you need and what types of activities are safe for you. What actions can I take to lose weight? Nutrition   Make changes to your diet as told by your health care provider or diet and nutrition specialist (dietitian). This may include: ? Eating fewer calories. ? Eating more  protein. ? Eating less unhealthy fats. ? Eating a diet that includes fresh fruits and vegetables, whole grains, low-fat dairy products, and lean protein. ? Avoiding foods with added fat, salt, and sugar.  Drink plenty of water while you exercise to prevent dehydration or heat stroke. Activity  Choose an activity that you enjoy and set realistic goals. Your health care provider can help you make an exercise plan that works for you.  Exercise at a moderate or vigorous intensity most days of the week. ? The intensity of exercise may vary from person to person. You can tell how intense a workout is for you by paying attention to your breathing and heartbeat. Most people will notice their breathing and heartbeat get faster with more intense exercise.  Do resistance training twice each week, such as: ? Push-ups. ? Sit-ups. ? Lifting weights. ? Using resistance bands.  Getting short amounts of exercise can be just as helpful as long structured periods of exercise. If you have trouble finding time to exercise, try to include exercise in your daily routine. ? Get up, stretch, and walk around every 30 minutes throughout the day. ? Go for a walk during your lunch break. ? Park your car farther away from your destination. ? If you take public transportation, get off one stop early and walk the rest of the way. ? Make phone calls while standing up and walking around. ? Take the stairs instead of elevators or escalators.  Wear comfortable clothes and shoes with good support.  Do not exercise so much that you hurt yourself, feel dizzy, or get very short of breath. Where to find more information  [place] Department of Health and Human Services:   www.hhs.gov  Centers for Disease Control and Prevention (CDC): www.cdc.gov Contact a health care provider:  Before starting a new exercise program.  If you have questions or concerns about your weight.  If you have a medical problem that keeps you from  exercising. Get help right away if you have any of the following while exercising:  Injury.  Dizziness.  Difficulty breathing or shortness of breath that does not go away when you stop exercising.  Chest pain.  Rapid heartbeat. Summary  Being overweight increases your risk of heart disease, stroke, diabetes, high blood pressure, and several types of cancer.  Losing weight happens when you burn more calories than you eat.  Reducing the amount of calories you eat in addition to getting regular moderate or vigorous exercise each week helps you lose weight. This information is not intended to replace advice given to you by your health care provider. Make sure you discuss any questions you have with your health care provider. Document Revised: 04/26/2017 Document Reviewed: 04/26/2017 [employer] Patient Education  2020 [employer]  

## 2019-05-25 NOTE — Progress Notes (Signed)
Subjective:  Patient ID: Anna Blake, female    DOB: 14-Aug-1985, 34 y.o.   MRN: [id number]  Patient Care Team: Sonny Masters, FNP as PCP - General (Family Medicine)   Chief Complaint:  review labs and discuss weight loss   HPI: Anna Blake is a 34 y.o. female presenting on 05/25/2019 for review labs and discuss weight loss   1. BMI 36.0-36.9,adult Has been dieting and exercising without loss of weight. Has tried fad diets, carb consistent diets, keto diets, and low calorie diets without success. Has been on Adipex in the past and was not beneficial. Had gastric sleeve and still can not keep weight off. Has been to nutritionist in the past.   2. Mixed hyperlipidemia Has changed diet drastically and is exercising more. Following recommended lifestyle changes daily.      Relevant past medical, surgical, family, and social history reviewed and updated as indicated.  Allergies and medications reviewed and updated. Date reviewed: Chart in Epic.   Past Medical History:  Diagnosis Date  . Abnormal vaginal Pap smear   . Anxiety   . Cystic acne   . Depression   . GERD (gastroesophageal reflux disease)     Past Surgical History:  Procedure Laterality Date  . carpel tunnel; release 02/03/19 Dr Amanda Pea  Bilateral   . CESAREAN SECTION    . CHOLECYSTECTOMY    . LAPAROSCOPIC GASTRIC SLEEVE RESECTION    . TONSILLECTOMY      Social History   Socioeconomic History  . Marital status: Single    Spouse name: Not on file  . Number of children: 1  . Years of education: Not on file  . Highest education level: Not on file  Occupational History  . Not on file  Tobacco Use  . Smoking status: Former Games developer  . Smokeless tobacco: Never Used  Substance and Sexual Activity  . Alcohol use: Never    Comment: social   . Drug use: Never  . Sexual activity: Yes    Partners: Male  Other Topics Concern  . Not on file  Social History Narrative  . Not on file   Social Determinants of  Health   Financial Resource Strain:   . Difficulty of Paying Living Expenses: Not on file  Food Insecurity:   . Worried About Programme researcher, broadcasting/film/video in the Last Year: Not on file  . Ran Out of Food in the Last Year: Not on file  Transportation Needs:   . Lack of Transportation (Medical): Not on file  . Lack of Transportation (Non-Medical): Not on file  Physical Activity:   . Days of Exercise per Week: Not on file  . Minutes of Exercise per Session: Not on file  Stress:   . Feeling of Stress : Not on file  Social Connections:   . Frequency of Communication with Friends and Family: Not on file  . Frequency of Social Gatherings with Friends and Family: Not on file  . Attends Religious Services: Not on file  . Active Member of Clubs or Organizations: Not on file  . Attends Banker Meetings: Not on file  . Marital Status: Not on file  Intimate Partner Violence:   . Fear of Current or Ex-Partner: Not on file  . Emotionally Abused: Not on file  . Physically Abused: Not on file  . Sexually Abused: Not on file    Outpatient Encounter Medications as of 05/25/2019  Medication Sig  . B Complex-Biotin-FA (B-COMPLEX PO) Take  by mouth.  . Biotin 1000 MCG tablet Take 1,000 mcg by mouth 3 (three) times daily.  Marland Kitchen buPROPion (WELLBUTRIN XL) 150 MG 24 hr tablet TAKE 2 TABLETS BY MOUTH IN THE MORNING  . FLUoxetine (PROZAC) 40 MG capsule Take 1 capsule (40 mg total) by mouth daily.  . pantoprazole (PROTONIX) 40 MG tablet Take 1 tablet (40 mg total) by mouth 2 (two) times daily.  Marland Kitchen senna-docusate (SENOKOT-S) 8.6-50 MG tablet Take 1 tablet by mouth daily.  . Liraglutide -Weight Management (SAXENDA) 18 MG/3ML SOPN Inject 0.6 mg into the skin daily. 0.6 mg SQ x 7 days 1.2 mg SQ x 7 days Then 3 mg SQ daily  . [DISCONTINUED] adapalene (DIFFERIN) 0.1 % gel Apply topically at bedtime.  . [DISCONTINUED] clindamycin-benzoyl peroxide (BENZACLIN) gel Apply topically 2 (two) times daily.   No  facility-administered encounter medications on file as of 05/25/2019.    Allergies  Allergen Reactions  . Z-Pak [Azithromycin] Hives    Review of Systems  Constitutional: Negative for activity change, appetite change, chills, diaphoresis, fatigue, fever and unexpected weight change.  HENT: Negative.   Eyes: Negative.   Respiratory: Negative for cough, chest tightness and shortness of breath.   Cardiovascular: Negative for chest pain, palpitations and leg swelling.  Gastrointestinal: Negative for abdominal pain, blood in stool, constipation, diarrhea, nausea and vomiting.  Endocrine: Negative.   Genitourinary: Negative for decreased urine volume, difficulty urinating, dysuria, frequency and urgency.  Musculoskeletal: Negative for arthralgias and myalgias.  Skin: Negative.   Allergic/Immunologic: Negative.   Neurological: Negative for dizziness and headaches.  Hematological: Negative.   Psychiatric/Behavioral: Negative for confusion, hallucinations, sleep disturbance and suicidal ideas.  All other systems reviewed and are negative.       Objective:  BP 117/75   Pulse 90   Temp 98.7 F (37.1 C)   Resp 20   Ht 5\' 2"  (1.575 m)   Wt 199 lb (90.3 kg)   SpO2 97%   BMI 36.40 kg/m    Wt Readings from Last 3 Encounters:  05/25/19 199 lb (90.3 kg)  03/15/19 199 lb (90.3 kg)  10/24/18 184 lb 8 oz (83.7 kg)    Physical Exam Vitals and nursing note reviewed.  Constitutional:      General: She is not in acute distress.    Appearance: Normal appearance. She is well-developed and well-groomed. She is obese. She is not ill-appearing, toxic-appearing or diaphoretic.  HENT:     Head: Normocephalic and atraumatic.     Jaw: There is normal jaw occlusion.     Right Ear: Hearing normal.     Left Ear: Hearing normal.     Nose: Nose normal.     Mouth/Throat:     Lips: Pink.     Mouth: Mucous membranes are moist.     Pharynx: Oropharynx is clear. Uvula midline.  Eyes:     General:  Lids are normal.     Extraocular Movements: Extraocular movements intact.     Conjunctiva/sclera: Conjunctivae normal.     Pupils: Pupils are equal, round, and reactive to light.  Neck:     Thyroid: No thyroid mass, thyromegaly or thyroid tenderness.     Vascular: No carotid bruit or JVD.     Trachea: Trachea and phonation normal.  Cardiovascular:     Rate and Rhythm: Normal rate and regular rhythm.     Chest Wall: PMI is not displaced.     Pulses: Normal pulses.     Heart sounds: Normal heart  sounds. No murmur. No friction rub. No gallop.   Pulmonary:     Effort: Pulmonary effort is normal. No respiratory distress.     Breath sounds: Normal breath sounds. No wheezing.  Abdominal:     General: Bowel sounds are normal. There is no distension or abdominal bruit.     Palpations: Abdomen is soft. There is no hepatomegaly or splenomegaly.     Tenderness: There is no abdominal tenderness. There is no right CVA tenderness or left CVA tenderness.     Hernia: No hernia is present.  Musculoskeletal:        General: Normal range of motion.     Cervical back: Normal range of motion and neck supple.     Right lower leg: No edema.     Left lower leg: No edema.  Lymphadenopathy:     Cervical: No cervical adenopathy.  Skin:    General: Skin is warm and dry.     Capillary Refill: Capillary refill takes less than 2 seconds.     Coloration: Skin is not cyanotic, jaundiced or pale.     Findings: No rash.  Neurological:     General: No focal deficit present.     Mental Status: She is alert and oriented to person, place, and time.     Cranial Nerves: Cranial nerves are intact. No cranial nerve deficit.     Sensory: Sensation is intact. No sensory deficit.     Motor: Motor function is intact. No weakness.     Coordination: Coordination is intact. Coordination normal.     Gait: Gait is intact. Gait normal.     Deep Tendon Reflexes: Reflexes are normal and symmetric. Reflexes normal.  Psychiatric:         Attention and Perception: Attention and perception normal.        Mood and Affect: Mood and affect normal.        Speech: Speech normal.        Behavior: Behavior normal. Behavior is cooperative.        Thought Content: Thought content normal.        Cognition and Memory: Cognition and memory normal.        Judgment: Judgment normal.     Results for orders placed or performed in visit on 05/23/19  Lipid panel  Result Value Ref Range   Cholesterol, Total 201 (H) 100 - 199 mg/dL   Triglycerides 884 (H) 0 - 149 mg/dL   HDL 54 >16 mg/dL   VLDL Cholesterol Cal 35 5 - 40 mg/dL   LDL Chol Calc (NIH) 606 (H) 0 - 99 mg/dL   Chol/HDL Ratio 3.7 0.0 - 4.4 ratio       Pertinent labs & imaging results that were available during my care of the patient were reviewed by me and considered in my medical decision making.  Assessment & Plan:  Anna Blake was seen today for review labs and discuss weight loss.  Diagnoses and all orders for this visit:  BMI 36.0-36.9,adult Encounter for weight management Has tried diet, exercise, Adipex, and gastric sleeve without success. Will initiate below. Diet and exercise discussed in detail. Pt will sign up for Saxenda savings program. Repeat BMI in 3 months with labs.  -     Liraglutide -Weight Management (SAXENDA) 18 MG/3ML SOPN; Inject 0.6 mg into the skin daily. 0.6 mg SQ x 7 days 1.2 mg SQ x 7 days Then 3 mg SQ daily  Mixed hyperlipidemia Diet and exercise encouraged. Repeat in  6 months.     Continue all other maintenance medications.  Follow up plan: Return in about 3 months (around 08/23/2019), or if symptoms worsen or fail to improve, for BMI.  Continue healthy lifestyle choices, including diet (rich in fruits, vegetables, and lean proteins, and low in salt and simple carbohydrates) and exercise (at least 30 minutes of moderate physical activity daily).  Educational handout given for weight management.   The above assessment and management  plan was discussed with the patient. The patient verbalized understanding of and has agreed to the management plan. Patient is aware to call the clinic if they develop any new symptoms or if symptoms persist or worsen. Patient is aware when to return to the clinic for a follow-up visit. Patient educated on when it is appropriate to go to the emergency department.   Monia Pouch, FNP-C [facility] [phone]

## 2019-05-26 ENCOUNTER — Encounter: Payer: Self-pay | Admitting: Family Medicine

## 2019-05-26 ENCOUNTER — Telehealth: Payer: Self-pay | Admitting: *Deleted

## 2019-05-26 LAB — HEPATIC FUNCTION PANEL
ALT: 15 IU/L (ref 0–32)
AST: 18 IU/L (ref 0–40)
Albumin: 4.2 g/dL (ref 3.8–4.8)
Alkaline Phosphatase: 119 IU/L — ABNORMAL HIGH (ref 39–117)
Bilirubin Total: <0.2 mg/dL (ref 0.0–1.2)
Bilirubin, Direct: 0.07 mg/dL (ref 0.00–0.40)
Total Protein: 6.6 g/dL (ref 6.0–8.5)

## 2019-05-26 LAB — SPECIMEN STATUS REPORT

## 2019-05-26 NOTE — Telephone Encounter (Signed)
PA was started for pt on medication  SAXENDA 18mg /2ml pen-injectors.  Sent to plan, waiting on fax   [id number]

## 2019-05-30 NOTE — Telephone Encounter (Signed)
Prior Auth for Saxenda-DENIED  The requested medication is not a covered benefit and are excluded from coverage in accordance with the terms and conditions of your plan benefit. Therefore this request has been administratively denied.  If the treating physician would like to discuss this coverage with the physician or health care professional reviewer, please call [employer] PA department at [phone].

## 2019-05-30 NOTE — Telephone Encounter (Signed)
Pt called back - [facility] ran with insurance, without insurance and with savings card - all ways it ran - it was still going to cost $1000 for 1 RX   Is there anything else she can try?

## 2019-05-30 NOTE — Telephone Encounter (Signed)
She was supposed to use the savings program through the drug company, did she try this?

## 2019-05-30 NOTE — Telephone Encounter (Signed)
Pt states that she tried to use the $25 savings card - Nicolette Bang states that she must have a PA and the see what the balance is before applying the savings card. She will call [facility] and let them know of denial = if she has further issues - she will call us.

## 2019-05-31 ENCOUNTER — Other Ambulatory Visit: Payer: Self-pay | Admitting: Family Medicine

## 2019-05-31 DIAGNOSIS — Z6836 Body mass index (BMI) 36.0-36.9, adult: Secondary | ICD-10-CM

## 2019-05-31 MED ORDER — METFORMIN HCL 500 MG PO TABS: 500.0000 mg | ORAL_TABLET | Freq: Two times a day (BID) | ORAL | 3 refills | Status: DC

## 2019-05-31 NOTE — Telephone Encounter (Signed)
Will send, thanks.

## 2019-05-31 NOTE — Telephone Encounter (Signed)
Spoke with pt - she is willing to try metformin.  Please send to [facility]

## 2019-05-31 NOTE — Telephone Encounter (Signed)
We can try metformin if she would like

## 2019-06-01 ENCOUNTER — Other Ambulatory Visit: Payer: Self-pay | Admitting: Family Medicine

## 2019-06-01 DIAGNOSIS — Z6836 Body mass index (BMI) 36.0-36.9, adult: Secondary | ICD-10-CM

## 2019-06-01 DIAGNOSIS — Z7689 Persons encountering health services in other specified circumstances: Secondary | ICD-10-CM

## 2019-06-01 MED ORDER — ORLISTAT 120 MG PO CAPS: 120.0000 mg | ORAL_CAPSULE | Freq: Three times a day (TID) | ORAL | 3 refills | Status: DC

## 2019-06-03 ENCOUNTER — Emergency Department (HOSPITAL_COMMUNITY): Payer: 59

## 2019-06-03 ENCOUNTER — Other Ambulatory Visit: Payer: Self-pay

## 2019-06-03 ENCOUNTER — Inpatient Hospital Stay (HOSPITAL_COMMUNITY)
Admission: EM | Admit: 2019-06-03 | Discharge: 2019-06-05 | DRG: 373 | Disposition: A | Discharge status: Home / Self Care | Payer: 59 | Attending: Physician Assistant | Admitting: Physician Assistant

## 2019-06-03 ENCOUNTER — Encounter (HOSPITAL_COMMUNITY): Payer: Self-pay

## 2019-06-03 DIAGNOSIS — E785 Hyperlipidemia, unspecified: Secondary | ICD-10-CM | POA: Diagnosis present

## 2019-06-03 DIAGNOSIS — Z6836 Body mass index (BMI) 36.0-36.9, adult: Secondary | ICD-10-CM

## 2019-06-03 DIAGNOSIS — Z9884 Bariatric surgery status: Secondary | ICD-10-CM

## 2019-06-03 DIAGNOSIS — K529 Noninfective gastroenteritis and colitis, unspecified: Secondary | ICD-10-CM | POA: Diagnosis present

## 2019-06-03 DIAGNOSIS — K3533 Acute appendicitis with perforation and localized peritonitis, with abscess: Secondary | ICD-10-CM | POA: Diagnosis present

## 2019-06-03 DIAGNOSIS — Z20822 Contact with and (suspected) exposure to covid-19: Secondary | ICD-10-CM | POA: Diagnosis present

## 2019-06-03 DIAGNOSIS — K581 Irritable bowel syndrome with constipation: Secondary | ICD-10-CM

## 2019-06-03 DIAGNOSIS — R519 Headache, unspecified: Secondary | ICD-10-CM | POA: Diagnosis present

## 2019-06-03 DIAGNOSIS — K651 Peritoneal abscess: Secondary | ICD-10-CM | POA: Diagnosis present

## 2019-06-03 DIAGNOSIS — K219 Gastro-esophageal reflux disease without esophagitis: Secondary | ICD-10-CM | POA: Diagnosis present

## 2019-06-03 DIAGNOSIS — Z79899 Other long term (current) drug therapy: Secondary | ICD-10-CM

## 2019-06-03 DIAGNOSIS — F419 Anxiety disorder, unspecified: Secondary | ICD-10-CM | POA: Diagnosis present

## 2019-06-03 DIAGNOSIS — F411 Generalized anxiety disorder: Secondary | ICD-10-CM | POA: Diagnosis present

## 2019-06-03 DIAGNOSIS — Z87891 Personal history of nicotine dependence: Secondary | ICD-10-CM

## 2019-06-03 DIAGNOSIS — F329 Major depressive disorder, single episode, unspecified: Secondary | ICD-10-CM | POA: Diagnosis present

## 2019-06-03 DIAGNOSIS — Z975 Presence of (intrauterine) contraceptive device: Secondary | ICD-10-CM

## 2019-06-03 DIAGNOSIS — Z888 Allergy status to other drugs, medicaments and biological substances status: Secondary | ICD-10-CM | POA: Diagnosis not present

## 2019-06-03 DIAGNOSIS — R109 Unspecified abdominal pain: Secondary | ICD-10-CM | POA: Diagnosis not present

## 2019-06-03 DIAGNOSIS — N739 Female pelvic inflammatory disease, unspecified: Secondary | ICD-10-CM | POA: Diagnosis present

## 2019-06-03 DIAGNOSIS — Z8349 Family history of other endocrine, nutritional and metabolic diseases: Secondary | ICD-10-CM

## 2019-06-03 DIAGNOSIS — R197 Diarrhea, unspecified: Secondary | ICD-10-CM

## 2019-06-03 DIAGNOSIS — R112 Nausea with vomiting, unspecified: Secondary | ICD-10-CM

## 2019-06-03 LAB — URINALYSIS, ROUTINE W REFLEX MICROSCOPIC
Bilirubin Urine: NEGATIVE
Glucose, UA: NEGATIVE mg/dL
Hgb urine dipstick: NEGATIVE
Ketones, ur: 20 mg/dL — AB
Leukocytes,Ua: NEGATIVE
Nitrite: NEGATIVE
Protein, ur: 30 mg/dL — AB
Specific Gravity, Urine: 1.033 — ABNORMAL HIGH (ref 1.005–1.030)
pH: 5 (ref 5.0–8.0)

## 2019-06-03 LAB — COMPREHENSIVE METABOLIC PANEL
ALT: 22 U/L (ref 0–44)
AST: 20 U/L (ref 15–41)
Albumin: 4.2 g/dL (ref 3.5–5.0)
Alkaline Phosphatase: 107 U/L (ref 38–126)
Anion gap: 11 (ref 5–15)
BUN: 12 mg/dL (ref 6–20)
CO2: 23 mmol/L (ref 22–32)
Calcium: 9.5 mg/dL (ref 8.9–10.3)
Chloride: 104 mmol/L (ref 98–111)
Creatinine, Ser: 0.85 mg/dL (ref 0.44–1.00)
GFR calc Af Amer: >60 mL/min (ref >60)
GFR calc non Af Amer: >60 mL/min (ref >60)
Glucose, Bld: 127 mg/dL — ABNORMAL HIGH (ref 70–99)
Potassium: 4.3 mmol/L (ref 3.5–5.1)
Sodium: 138 mmol/L (ref 135–145)
Total Bilirubin: 0.6 mg/dL (ref 0.3–1.2)
Total Protein: 7.1 g/dL (ref 6.5–8.1)

## 2019-06-03 LAB — CBC WITH DIFFERENTIAL/PLATELET
Abs Immature Granulocytes: 0.16 10*3/uL — ABNORMAL HIGH (ref 0.00–0.07)
Basophils Absolute: 0.1 10*3/uL (ref 0.0–0.1)
Basophils Relative: 0 %
Eosinophils Absolute: 0.3 10*3/uL (ref 0.0–0.5)
Eosinophils Relative: 1 %
HCT: 45.7 % (ref 36.0–46.0)
Hemoglobin: 15.1 g/dL — ABNORMAL HIGH (ref 12.0–15.0)
Immature Granulocytes: 1 %
Lymphocytes Relative: 5 %
Lymphs Abs: 1.3 10*3/uL (ref 0.7–4.0)
MCH: 30.6 pg (ref 26.0–34.0)
MCHC: 33 g/dL (ref 30.0–36.0)
MCV: 92.5 fL (ref 80.0–100.0)
Monocytes Absolute: 1 10*3/uL (ref 0.1–1.0)
Monocytes Relative: 4 %
Neutro Abs: 21.5 10*3/uL — ABNORMAL HIGH (ref 1.7–7.7)
Neutrophils Relative %: 89 %
Platelets: 289 10*3/uL (ref 150–400)
RBC: 4.94 MIL/uL (ref 3.87–5.11)
RDW: 11.9 % (ref 11.5–15.5)
WBC: 24.2 10*3/uL — ABNORMAL HIGH (ref 4.0–10.5)
nRBC: 0 % (ref 0.0–0.2)

## 2019-06-03 LAB — I-STAT BETA HCG BLOOD, ED (MC, WL, AP ONLY): I-stat hCG, quantitative: <5 m[IU]/mL (ref <5)

## 2019-06-03 LAB — LIPASE, BLOOD: Lipase: 24 U/L (ref 11–51)

## 2019-06-03 LAB — POC SARS CORONAVIRUS 2 AG -  ED: SARS Coronavirus 2 Ag: NEGATIVE

## 2019-06-03 MED ORDER — ACETAMINOPHEN 650 MG RE SUPP: 650.0000 mg | Freq: Four times a day (QID) | RECTAL | Status: DC | PRN

## 2019-06-03 MED ORDER — ONDANSETRON 4 MG PO TBDP: 4.0000 mg | ORAL_TABLET | Freq: Four times a day (QID) | ORAL | Status: DC | PRN

## 2019-06-03 MED ORDER — PIPERACILLIN-TAZOBACTAM 3.375 G IVPB
3.3750 g | Freq: Three times a day (TID) | INTRAVENOUS | Status: DC
  Administered 2019-06-04 – 2019-06-05 (×4): 3.375 g via INTRAVENOUS
  Filled 2019-06-03 (×5): qty 50

## 2019-06-03 MED ORDER — SENNA 8.6 MG PO TABS
1.0000 | ORAL_TABLET | Freq: Two times a day (BID) | ORAL | Status: DC
  Administered 2019-06-03 – 2019-06-04 (×2): 8.6 mg via ORAL
  Filled 2019-06-03 (×2): qty 1

## 2019-06-03 MED ORDER — IOHEXOL 300 MG/ML  SOLN
100.0000 mL | Freq: Once | INTRAMUSCULAR | Status: AC | PRN
  Administered 2019-06-03: 100 mL via INTRAVENOUS

## 2019-06-03 MED ORDER — BUPROPION HCL ER (XL) 150 MG PO TB24
300.0000 mg | ORAL_TABLET | Freq: Every day | ORAL | Status: DC
  Administered 2019-06-04 – 2019-06-05 (×2): 300 mg via ORAL
  Filled 2019-06-03 (×2): qty 2

## 2019-06-03 MED ORDER — HYDROMORPHONE HCL 1 MG/ML IJ SOLN
1.0000 mg | INTRAMUSCULAR | Status: DC | PRN
  Administered 2019-06-04 (×2): 1 mg via INTRAVENOUS
  Filled 2019-06-03 (×2): qty 1

## 2019-06-03 MED ORDER — ACETAMINOPHEN 325 MG PO TABS: 650.0000 mg | ORAL_TABLET | Freq: Four times a day (QID) | ORAL | Status: DC | PRN

## 2019-06-03 MED ORDER — DIPHENHYDRAMINE HCL 25 MG PO CAPS: 25.0000 mg | ORAL_CAPSULE | Freq: Four times a day (QID) | ORAL | Status: DC | PRN

## 2019-06-03 MED ORDER — FLUOXETINE HCL 20 MG PO CAPS
40.0000 mg | ORAL_CAPSULE | Freq: Every day | ORAL | Status: DC
  Administered 2019-06-04 – 2019-06-05 (×2): 40 mg via ORAL
  Filled 2019-06-03 (×2): qty 2

## 2019-06-03 MED ORDER — PIPERACILLIN-TAZOBACTAM 3.375 G IVPB 30 MIN
3.3750 g | Freq: Once | INTRAVENOUS | Status: AC
  Administered 2019-06-03: 21:00:00 3.375 g via INTRAVENOUS
  Filled 2019-06-03: qty 50

## 2019-06-03 MED ORDER — SODIUM CHLORIDE 0.9 % IV BOLUS
1000.0000 mL | Freq: Once | INTRAVENOUS | Status: AC
  Administered 2019-06-03: 19:00:00 1000 mL via INTRAVENOUS

## 2019-06-03 MED ORDER — ONDANSETRON HCL 4 MG/2ML IJ SOLN
4.0000 mg | Freq: Four times a day (QID) | INTRAMUSCULAR | Status: DC | PRN
  Administered 2019-06-04 (×2): 4 mg via INTRAVENOUS
  Filled 2019-06-03 (×2): qty 2

## 2019-06-03 MED ORDER — POTASSIUM CHLORIDE IN NACL 20-0.9 MEQ/L-% IV SOLN
INTRAVENOUS | Status: DC
  Filled 2019-06-03 (×3): qty 1000

## 2019-06-03 MED ORDER — DIPHENHYDRAMINE HCL 50 MG/ML IJ SOLN: 25.0000 mg | Freq: Four times a day (QID) | INTRAMUSCULAR | Status: DC | PRN

## 2019-06-03 MED ORDER — ONDANSETRON HCL 4 MG/2ML IJ SOLN
4.0000 mg | Freq: Once | INTRAMUSCULAR | Status: AC
  Administered 2019-06-03: 4 mg via INTRAVENOUS
  Filled 2019-06-03: qty 2

## 2019-06-03 MED ORDER — PANTOPRAZOLE SODIUM 40 MG IV SOLR
40.0000 mg | Freq: Every day | INTRAVENOUS | Status: DC
  Administered 2019-06-03 – 2019-06-04 (×2): 40 mg via INTRAVENOUS
  Filled 2019-06-03 (×2): qty 40

## 2019-06-03 NOTE — ED Notes (Signed)
Attempted to call report to 6N.

## 2019-06-03 NOTE — Progress Notes (Signed)
Pharmacy Antibiotic Note  Anna Blake is a 34 y.o. female admitted on 06/03/2019 with N/V/D, CT showed pericolonic abscess w/perf.  Pharmacy has been consulted for Zosyn dosing.  WBC 24.2, SCr 0.85, CrCl ~95 ml/min  Plan: Zosyn 3.375g IV every 8 hours (extended 4 hour infusion) Monitor renal function, clinical progression and LOT     Temp (24hrs), Avg:98.5 F (36.9 C), Min:98.5 F (36.9 C), Max:98.5 F (36.9 C)  Recent Labs  Lab 06/03/19 1848  WBC 24.2*  CREATININE 0.85    Estimated Creatinine Clearance: 95.6 mL/min (by C-G formula based on SCr of 0.85 mg/dL).    Allergies  Allergen Reactions  . Z-Pak [Azithromycin] Hives    Daylene Posey, PharmD Clinical Pharmacist Please check AMION for all [facility] numbers 06/03/2019 8:49 PM

## 2019-06-03 NOTE — H&P (Signed)
Anna Blake is an 34 y.o. female.   Chief Complaint: N/V/Diarrhea/ abdominal cramping HPI: This is a 34 year old female s/p gastric sleeve s/p lap chole who presents with about 24 hours of headache, vague abdominal cramping, nausea, vomiting and diarrhea.  Non-bloody vomitus.  No melena or hematochezia.  She recently started taking Metformin.  She also started a "colon cleanse with natural supplements" on Wednesday.  Afebrile.  She was noted to have an elevated WBC and a CT scan that showed a large pelvic abscess.    Patient reports chronic constipation and uses stool softeners daily.    Her father was recently diagnosed with right colon cancer.  Past Medical History:  Diagnosis Date  . Abnormal vaginal Pap smear   . Anxiety   . Cystic acne   . Depression   . GERD (gastroesophageal reflux disease)     Past Surgical History:  Procedure Laterality Date  . carpel tunnel; release 02/03/19 Dr Amedeo Plenty  Bilateral   . CESAREAN SECTION    . CHOLECYSTECTOMY    . LAPAROSCOPIC GASTRIC SLEEVE RESECTION    . TONSILLECTOMY      Family History  Problem Relation Age of Onset  . Congestive Heart Failure Father   . Hyperlipidemia Father   . Hypertension Father   . Heart disease Maternal Grandmother   . Hypertension Maternal Grandmother   . Hyperlipidemia Maternal Grandmother   . Diabetes Maternal Grandmother   . Cancer Maternal Grandfather        prostate   Social History:  reports that she has quit smoking. She has never used smokeless tobacco. She reports that she does not drink alcohol or use drugs.  Allergies:  Allergies  Allergen Reactions  . Z-Pak [Azithromycin] Hives    Prior to Admission medications   Medication Sig Start Date End Date Taking? Authorizing Provider  B Complex-Biotin-FA (B-COMPLEX PO) Take by mouth.    [provider]  Biotin 1000 MCG tablet Take 1,000 mcg by mouth 3 (three) times daily.    [provider]  buPROPion (WELLBUTRIN XL) 150 MG 24 hr  tablet TAKE 2 TABLETS BY MOUTH IN THE MORNING 03/15/19   Baruch Gouty, FNP  FLUoxetine (PROZAC) 40 MG capsule Take 1 capsule (40 mg total) by mouth daily. 03/15/19 05/25/19  Baruch Gouty, FNP  orlistat (XENICAL) 120 MG capsule Take 1 capsule (120 mg total) by mouth 3 (three) times daily with meals. 06/01/19 07/01/19  Baruch Gouty, FNP  pantoprazole (PROTONIX) 40 MG tablet Take 1 tablet (40 mg total) by mouth 2 (two) times daily. 03/15/19 05/25/19  Baruch Gouty, FNP  senna-docusate (SENOKOT-S) 8.6-50 MG tablet Take 1 tablet by mouth daily.    [provider]     Results for orders placed or performed during the hospital encounter of 06/03/19 (from the past 48 hour(s))  Comprehensive metabolic panel     Status: Abnormal   Collection Time: 06/03/19  6:48 PM  Result Value Ref Range   Sodium 138 135 - 145 mmol/L   Potassium 4.3 3.5 - 5.1 mmol/L   Chloride 104 98 - 111 mmol/L   CO2 23 22 - 32 mmol/L   Glucose, Bld 127 (H) 70 - 99 mg/dL   BUN 12 6 - 20 mg/dL   Creatinine, Ser 0.85 0.44 - 1.00 mg/dL   Calcium 9.5 8.9 - 10.3 mg/dL   Total Protein 7.1 6.5 - 8.1 g/dL   Albumin 4.2 3.5 - 5.0 g/dL   AST 20  15 - 41 U/L   ALT 22 0 - 44 U/L   Alkaline Phosphatase 107 38 - 126 U/L   Total Bilirubin 0.6 0.3 - 1.2 mg/dL   GFR calc non Af Amer >60 >60 mL/min   GFR calc Af Amer >60 >60 mL/min   Anion gap 11 5 - 15    Comment: Performed at [facility] [address] [place], [place] [address]  CBC with Differential     Status: Abnormal   Collection Time: 06/03/19  6:48 PM  Result Value Ref Range   WBC 24.2 (H) 4.0 - 10.5 K/uL   RBC 4.94 3.87 - 5.11 MIL/uL   Hemoglobin 15.1 (H) 12.0 - 15.0 g/dL   HCT 45.7 36.0 - 46.0 %   MCV 92.5 80.0 - 100.0 fL   MCH 30.6 26.0 - 34.0 pg   MCHC 33.0 30.0 - 36.0 g/dL   RDW 11.9 11.5 - 15.5 %   Platelets 289 150 - 400 K/uL   nRBC 0.0 0.0 - 0.2 %   Neutrophils Relative % 89 %   Neutro Abs 21.5 (H) 1.7 - 7.7 K/uL   Lymphocytes Relative 5 %    Lymphs Abs 1.3 0.7 - 4.0 K/uL   Monocytes Relative 4 %   Monocytes Absolute 1.0 0.1 - 1.0 K/uL   Eosinophils Relative 1 %   Eosinophils Absolute 0.3 0.0 - 0.5 K/uL   Basophils Relative 0 %   Basophils Absolute 0.1 0.0 - 0.1 K/uL   Immature Granulocytes 1 %   Abs Immature Granulocytes 0.16 (H) 0.00 - 0.07 K/uL    Comment: Performed at [facility] [address] [place], [place] [address]  Lipase, blood     Status: None   Collection Time: 06/03/19  6:48 PM  Result Value Ref Range   Lipase 24 11 - 51 U/L    Comment: Performed at [facility] [address], [place], [place] [address]  I-Stat beta hCG blood, ED     Status: None   Collection Time: 06/03/19  6:56 PM  Result Value Ref Range   I-stat hCG, quantitative <5.0 <5 mIU/mL   Comment 3            Comment:   GEST. AGE      CONC.  (mIU/mL)   <=1 WEEK        5 - 50     2 WEEKS       50 - 500     3 WEEKS       100 - 10,000     4 WEEKS     1,000 - 30,000        FEMALE AND NON-PREGNANT FEMALE:     LESS THAN 5 mIU/mL   POC SARS Coronavirus 2 Ag-ED - Nasal Swab (BD Veritor Kit)     Status: None   Collection Time: 06/03/19  7:26 PM  Result Value Ref Range   SARS Coronavirus 2 Ag NEGATIVE NEGATIVE    Comment: (NOTE) SARS-CoV-2 antigen NOT DETECTED.  Negative results are presumptive.  Negative results do not preclude SARS-CoV-2 infection and should not be used as the sole basis for treatment or other patient management decisions, including infection  control decisions, particularly in the presence of clinical signs and  symptoms consistent with COVID-19, or in those who have been in contact with the virus.  Negative results must be combined with clinical observations, patient history, and epidemiological information. The expected result is  Negative. Fact Sheet for Patients: PodPark.tn Fact Sheet for Healthcare Providers: GiftContent.is This test is not yet  approved or cleared by the [place] FDA and  has been authorized for detection and/or diagnosis of SARS-CoV-2 by FDA under an Emergency Use Authorization (EUA).  This EUA will remain in effect (meaning this test can be used) for the duration of  the COVID-19 de claration under Section 564(b)(1) of the Act, 21 U.S.C. section 360bbb-3(b)(1), unless the authorization is terminated or revoked sooner.   Urinalysis, Routine w reflex microscopic     Status: Abnormal   Collection Time: 06/03/19  7:47 PM  Result Value Ref Range   Color, Urine YELLOW YELLOW   APPearance HAZY (A) CLEAR   Specific Gravity, Urine 1.033 (H) 1.005 - 1.030   pH 5.0 5.0 - 8.0   Glucose, UA NEGATIVE NEGATIVE mg/dL   Hgb urine dipstick NEGATIVE NEGATIVE   Bilirubin Urine NEGATIVE NEGATIVE   Ketones, ur 20 (A) NEGATIVE mg/dL   Protein, ur 30 (A) NEGATIVE mg/dL   Nitrite NEGATIVE NEGATIVE   Leukocytes,Ua NEGATIVE NEGATIVE   RBC / HPF 0-5 0 - 5 RBC/hpf   WBC, UA 0-5 0 - 5 WBC/hpf   Bacteria, UA RARE (A) NONE SEEN   Squamous Epithelial / LPF 0-5 0 - 5   Mucus PRESENT     Comment: Performed at [facility], [address] [place], [place] [address]   CT ABDOMEN PELVIS W CONTRAST  Result Date: 06/03/2019 CLINICAL DATA:  Abdominal pain EXAM: CT ABDOMEN AND PELVIS WITH CONTRAST TECHNIQUE: Multidetector CT imaging of the abdomen and pelvis was performed using the standard protocol following bolus administration of intravenous contrast. CONTRAST:  166m OMNIPAQUE IOHEXOL 300 MG/ML  SOLN COMPARISON:  None. FINDINGS: Lower chest: The visualized heart size within normal limits. No pericardial fluid/thickening. No hiatal hernia. The visualized portions of the lungs are clear. Hepatobiliary: The liver is normal in density without focal abnormality.The main portal vein is patent. No evidence of calcified gallstones, gallbladder wall thickening or biliary dilatation. Pancreas: Unremarkable. No pancreatic ductal dilatation or  surrounding inflammatory changes. Spleen: Normal in size without focal abnormality. Adrenals/Urinary Tract: Both adrenal glands appear normal. The kidneys and collecting system appear normal without evidence of urinary tract calculus or hydronephrosis. Bladder is decompressed. Stomach/Bowel: The patient is status post gastric sleeve resection. A small hiatal hernia is present with food debris. The small bowel is unremarkable. Within the right lower quadrant there is a complex fluid collection with small foci of air seen within it measuring 9.6 x 4.2 cm extending from the distal cecal tip, which is likely due to perforated appendicitis. There is a small tubular air-filled structure seen within right lower quadrant, series 3, image 61 which could be a tiny focal portion of the appendix. Vascular/Lymphatic: There are no enlarged mesenteric, retroperitoneal, or pelvic lymph nodes. No significant vascular findings are present. Reproductive: IUD is seen within the endometrial canal. Within the left adnexa there is a 4.1 cm low-density lesion, likely ovarian cyst. Other: No evidence of abdominal wall mass or hernia. Musculoskeletal: No acute or significant osseous findings. IMPRESSION: Complex loculated fluid collection within the right lower quadrant measuring 9.6 x 4.2 cm extending from the the distal cecal tip which is likely periappendiceal or pericolonic abscess with perforation. Electronically Signed   By: BPrudencio PairM.D.   On: 06/03/2019 20:34    Review of Systems Review of Systems  Constitutional: Negative for fever, chills and unexpected weight change.  HENT: Negative for hearing loss, congestion, sore throat, trouble swallowing and voice change.  Eyes: Negative for visual disturbance.  Respiratory: Negative for cough and wheezing.  Cardiovascular: Negative for chest pain, palpitations and leg swelling.  Gastrointestinal: Positive for nausea, vomiting, abdominal pain, diarrhea, Negative for  constipation, blood in stool, abdominal distention and anal bleeding.  Genitourinary: Negative for hematuria, vaginal bleeding and difficulty urinating.  Musculoskeletal: Negative for arthralgias.  Skin: Negative for rash and wound.  Neurological: Negative for seizures, syncope and headaches.  Hematological: Negative for adenopathy. Does not bruise/bleed easily.  Psychiatric/Behavioral: Negative for confusion.  10-system review otherwise negative.  Blood pressure 127/83, pulse 93, temperature 98.5 F (36.9 C), temperature source Oral, resp. rate 15, SpO2 97 %. Physical Exam  Constitutional:  WDWN in NAD, conversant, no obvious deformities; lying in bed comfortably Eyes:  Pupils equal, round; sclera anicteric; moist conjunctiva; no lid lag HENT:  Oral mucosa moist; good dentition  Neck:  No masses palpated, trachea midline; no thyromegaly Lungs:  CTA bilaterally; normal respiratory effort CV:  Regular rate and rhythm; no murmurs; extremities well-perfused with no edema Abd:  +bowel sounds, soft, mild generalized tenderness.  Some moderate tenderness in RLQ.  No palpable organomegaly; no palpable hernias Musc:  Unable to assess gait; no apparent clubbing or cyanosis in extremities Lymphatic:  No palpable cervical or axillary lymphadenopathy Skin:  Warm, dry; no sign of jaundice Psychiatric - alert and oriented x 4; calm mood and affect  Assessment/Plan 1.  Pelvic abscess - probable from perforated appendix, although her history is unusual for the relatively short time course.  IV antibiotics IV hydration IR - percutaneous drain tomorrow.  Interval appendectomy.  Maia Petties, MD 06/03/2019, 9:55 PM

## 2019-06-03 NOTE — ED Provider Notes (Signed)
[facility] EMERGENCY DEPARTMENT Provider Note   CSN: [id number] Arrival date & time: 06/03/19  1807     History Chief Complaint  Patient presents with  . Emesis  . Headache  . Diarrhea    Anna Blake is a 34 y.o. female past medical history depression, GERD who presents for evaluation of nausea, vomiting, diarrhea, abdominal cramping that began today.  Patient reports that she ate lunch at [employer].  She reports about 20-30 minutes afterwards, she started having vomiting and diarrhea.  She states no blood in vomit or stools.  She has not been able to keep anything down since.  She developed some diffuse abdominal cramping after her symptoms began.  She reports she had one episode of vomiting last night but states that she thought it was because she recently started Metformin.  She had felt fine today and was in her usual state of health.  She has not had any fevers.  She states that other people have eaten the same food and did not have any symptoms.  She states she also feels like her throat is sore but it may be from all the vomiting.  She also reports that she has a headache.  She states headache started yesterday and was mild.  It has progressively worsened but she states it is still a very mild, dull headache.  No preceding trauma, injury, fall.  She denies any fevers, vision changes, chest pain, difficulty breathing, dysuria, hematuria, numbness/weakness of arms or legs.  No recent travel or antibiotic use.  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  No known Covid exposure.  She does report that she has family members who have transported Covid positive patients.  The history is provided by the patient.       Past Medical History:  Diagnosis Date  . Abnormal vaginal Pap smear   . Anxiety   . Cystic acne   . Depression   . GERD (gastroesophageal reflux disease)     Patient Active Problem List   Diagnosis Date Noted  . Pelvic abscess in female 06/03/2019  .  Mixed hyperlipidemia 05/25/2019  . GAD (generalized anxiety disorder) 03/15/2019  . Depression, recurrent ([place]) 03/15/2019  . Cystic acne vulgaris 03/15/2019  . BMI 36.0-36.9,adult 03/15/2019  . IUD (intrauterine device) in place 10/26/2018  . Former tobacco use 10/26/2018  . History of cholecystectomy 10/26/2018  . History of tonsillectomy 10/26/2018  . History of bariatric surgery 10/26/2018  . History of abnormal cervical Pap smear 10/26/2018  . Anxiety and depression 10/26/2018  . Gastroesophageal reflux disease without esophagitis 10/26/2018  . Abnormal weight gain 10/26/2018    Past Surgical History:  Procedure Laterality Date  . carpel tunnel; release 02/03/19 Dr Amedeo Plenty  Bilateral   . CESAREAN SECTION    . CHOLECYSTECTOMY    . LAPAROSCOPIC GASTRIC SLEEVE RESECTION    . TONSILLECTOMY       OB History   No obstetric history on file.     Family History  Problem Relation Age of Onset  . Congestive Heart Failure Father   . Hyperlipidemia Father   . Hypertension Father   . Heart disease Maternal Grandmother   . Hypertension Maternal Grandmother   . Hyperlipidemia Maternal Grandmother   . Diabetes Maternal Grandmother   . Cancer Maternal Grandfather        prostate    Social History   Tobacco Use  . Smoking status: Former Research scientist (life sciences)  . Smokeless tobacco: Never Used  Substance  Use Topics  . Alcohol use: Never    Comment: social   . Drug use: Never    Home Medications Prior to Admission medications   Medication Sig Start Date End Date Taking? Authorizing Provider  Biotin 1000 MCG tablet Take 1,000 mcg by mouth daily.    Yes [provider]  buPROPion (WELLBUTRIN XL) 150 MG 24 hr tablet TAKE 2 TABLETS BY MOUTH IN THE MORNING Patient taking differently: Take 300 mg by mouth daily.  03/15/19  Yes Rakes, Doralee Albino, FNP  docusate sodium (COLACE) 100 MG capsule Take 200 mg by mouth daily.   Yes [provider]  FLUoxetine (PROZAC) 40 MG capsule Take 1  capsule (40 mg total) by mouth daily. 03/15/19 07/26/19 Yes Rakes, Doralee Albino, FNP  ibuprofen (ADVIL) 200 MG tablet Take 400 mg by mouth daily as needed for headache.   Yes [provider]  pantoprazole (PROTONIX) 40 MG tablet Take 1 tablet (40 mg total) by mouth 2 (two) times daily. 03/15/19 07/26/19 Yes Rakes, Doralee Albino, FNP  orlistat (XENICAL) 120 MG capsule Take 1 capsule (120 mg total) by mouth 3 (three) times daily with meals. Patient not taking: Reported on 06/03/2019 06/01/19 07/01/19  Sonny Masters, FNP    Allergies    Z-pak [azithromycin]  Review of Systems   Review of Systems  Constitutional: Negative for fever.  Eyes: Negative for visual disturbance.  Respiratory: Negative for cough and shortness of breath.   Cardiovascular: Negative for chest pain.  Gastrointestinal: Positive for abdominal pain, nausea and vomiting. Negative for blood in stool.  Genitourinary: Negative for dysuria and hematuria.  Neurological: Negative for weakness, numbness and headaches.  All other systems reviewed and are negative.   Physical Exam Updated Vital Signs BP 127/83   Pulse 93   Temp 98.5 F (36.9 C) (Oral)   Resp 15   SpO2 97%   Physical Exam Vitals and nursing note reviewed.  Constitutional:      Appearance: Normal appearance. She is well-developed.     Comments: Sitting comfortably on examination table  HENT:     Head: Normocephalic and atraumatic.     Mouth/Throat:     Pharynx: Oropharynx is clear.     Comments: Posterior oropharynx is clear without any erythema, edema.  No exudates noted.  Airways patent, phonation is intact.  Uvula is midline. Eyes:     General: Lids are normal.     Conjunctiva/sclera: Conjunctivae normal.     Pupils: Pupils are equal, round, and reactive to light.     Comments: PERRL. EOMs intact. No nystagmus. No neglect.   Neck:     Comments: Neck is supple and without rigidity. Cardiovascular:     Rate and Rhythm: Normal rate and regular rhythm.      Pulses: Normal pulses.     Heart sounds: Normal heart sounds. No murmur. No friction rub. No gallop.   Pulmonary:     Effort: Pulmonary effort is normal.     Breath sounds: Normal breath sounds.     Comments: Lungs clear to auscultation bilaterally.  Symmetric chest rise.  No wheezing, rales, rhonchi. Abdominal:     Palpations: Abdomen is soft. Abdomen is not rigid.     Tenderness: There is generalized abdominal tenderness. There is no guarding.     Comments: Abdomen soft, nondistended.  Generalized tenderness no focal point.  No focal tenderness in McBurney's point.  No rigidity, guarding.  No CVA tenderness noted bilaterally.  Musculoskeletal:  General: Normal range of motion.     Cervical back: Full passive range of motion without pain.  Skin:    General: Skin is warm and dry.     Capillary Refill: Capillary refill takes less than 2 seconds.  Neurological:     Mental Status: She is alert and oriented to person, place, and time.     Comments: Cranial nerves III-XII intact Follows commands, Moves all extremities  5/5 strength to BUE and BLE  Sensation intact throughout all major nerve distributions Normal finger to nose No slurred speech. No facial droop.   Psychiatric:        Speech: Speech normal.     ED Results / Procedures / Treatments   Labs (all labs ordered are listed, but only abnormal results are displayed) Labs Reviewed  COMPREHENSIVE METABOLIC PANEL - Abnormal; Notable for the following components:      Result Value   Glucose, Bld 127 (*)    All other components within normal limits  CBC WITH DIFFERENTIAL/PLATELET - Abnormal; Notable for the following components:   WBC 24.2 (*)    Hemoglobin 15.1 (*)    Neutro Abs 21.5 (*)    Abs Immature Granulocytes 0.16 (*)    All other components within normal limits  URINALYSIS, ROUTINE W REFLEX MICROSCOPIC - Abnormal; Notable for the following components:   APPearance HAZY (*)    Specific Gravity, Urine 1.033 (*)      Ketones, ur 20 (*)    Protein, ur 30 (*)    Bacteria, UA RARE (*)    All other components within normal limits  SARS CORONAVIRUS 2 (TAT 6-24 HRS)  LIPASE, BLOOD  HIV ANTIBODY (ROUTINE TESTING W REFLEX)  BASIC METABOLIC PANEL  CBC  I-STAT BETA HCG BLOOD, ED ([facility], [facility], [facility] ONLY)  POC SARS CORONAVIRUS 2 AG -  ED    EKG None  Radiology CT ABDOMEN PELVIS W CONTRAST  Result Date: 06/03/2019 CLINICAL DATA:  Abdominal pain EXAM: CT ABDOMEN AND PELVIS WITH CONTRAST TECHNIQUE: Multidetector CT imaging of the abdomen and pelvis was performed using the standard protocol following bolus administration of intravenous contrast. CONTRAST:  OMNIPAQUE IOHEXOL 300 MG/ML  SOLN COMPARISON:  None. FINDINGS: Lower chest: The visualized heart size within normal limits. No pericardial fluid/thickening. No hiatal hernia. The visualized portions of the lungs are clear. Hepatobiliary: The liver is normal in density without focal abnormality.The main portal vein is patent. No evidence of calcified gallstones, gallbladder wall thickening or biliary dilatation. Pancreas: Unremarkable. No pancreatic ductal dilatation or surrounding inflammatory changes. Spleen: Normal in size without focal abnormality. Adrenals/Urinary Tract: Both adrenal glands appear normal. The kidneys and collecting system appear normal without evidence of urinary tract calculus or hydronephrosis. Bladder is decompressed. Stomach/Bowel: The patient is status post gastric sleeve resection. A small hiatal hernia is present with food debris. The small bowel is unremarkable. Within the right lower quadrant there is a complex fluid collection with small foci of air seen within it measuring 9.6 x 4.2 cm extending from the distal cecal tip, which is likely due to perforated appendicitis. There is a small tubular air-filled structure seen within right lower quadrant, series 3, image 61 which could be a tiny focal portion of the appendix. Vascular/Lymphatic:  There are no enlarged mesenteric, retroperitoneal, or pelvic lymph nodes. No significant vascular findings are present. Reproductive: IUD is seen within the endometrial canal. Within the left adnexa there is a 4.1 cm low-density lesion, likely ovarian cyst. Other: No evidence of  abdominal wall mass or hernia. Musculoskeletal: No acute or significant osseous findings. IMPRESSION: Complex loculated fluid collection within the right lower quadrant measuring 9.6 x 4.2 cm extending from the the distal cecal tip which is likely periappendiceal or pericolonic abscess with perforation. Electronically Signed   By: Jonna Clark M.D.   On: 06/03/2019 20:34    Procedures Procedures (including critical care time)  Medications Ordered in ED Medications  piperacillin-tazobactam (ZOSYN) IVPB 3.375 g (has no administration in time range)  buPROPion (WELLBUTRIN XL) 24 hr tablet 300 mg (has no administration in time range)  FLUoxetine (PROZAC) capsule 40 mg (has no administration in time range)  0.9 % NaCl with KCl 20 mEq/ L  infusion (has no administration in time range)  acetaminophen (TYLENOL) tablet 650 mg (has no administration in time range)    Or  acetaminophen (TYLENOL) suppository 650 mg (has no administration in time range)  HYDROmorphone (DILAUDID) injection 1 mg (has no administration in time range)  diphenhydrAMINE (BENADRYL) capsule 25 mg (has no administration in time range)    Or  diphenhydrAMINE (BENADRYL) injection 25 mg (has no administration in time range)  senna (SENOKOT) tablet 8.6 mg (8.6 mg Oral Given 06/03/19 2216)  ondansetron (ZOFRAN-ODT) disintegrating tablet 4 mg (has no administration in time range)    Or  ondansetron (ZOFRAN) injection 4 mg (has no administration in time range)  pantoprazole (PROTONIX) injection 40 mg (40 mg Intravenous Given 06/03/19 2216)  sodium chloride 0.9 % bolus 1,000 mL (0 mLs Intravenous Stopped 06/03/19 1938)  ondansetron (ZOFRAN) injection 4 mg (4 mg  Intravenous Given 06/03/19 1904)  iohexol (OMNIPAQUE) 300 MG/ML solution 100 mL (100 mLs Intravenous Contrast Given 06/03/19 2011)  piperacillin-tazobactam (ZOSYN) IVPB 3.375 g (0 g Intravenous Stopped 06/03/19 2141)  ondansetron (ZOFRAN) injection 4 mg (4 mg Intravenous Given 06/03/19 2111)    ED Course  I have reviewed the triage vital signs and the nursing notes.  Pertinent labs & imaging results that were available during my care of the patient were reviewed by me and considered in my medical decision making (see chart for details).    MDM Rules/Calculators/A&P                      34 year old female who presents for evaluation of nausea/vomiting/diarrhea generalized abdominal pain.  Also reports headache.  No fevers, urinary complaints.  On initial arrival, she is afebrile, nontoxic-appearing.  She is slightly tachycardic but vitals otherwise stable.  She does endorse that she had a headache that began yesterday that was more mild in nature.  She states it is still mild.  No fevers, neuro deficits.  No preceding trauma, injury.  She has generalized tenderness noted to her abdomen.  I suspect this is more likely from vomiting as the abdominal pain started after vomiting.  Consider foodborne illness versus viral illness.  Also consider COVID-19.  Hstory/physical exam is not concerning for CVA, meningitis, intracranial hemorrhage.  I discussed at length with patient regarding work-up.  At this time, patient has no red flags or risk factors for intracranial pathology.  I did discuss that sometimes vomiting with headache indicates head CT.  We discussed at length.  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  We discussed risk first benefits, including but not limited to finding such as intracranial hemorrhage, intracranial mass.  At this time, patient would like to wait on the head CT and get basic blood work first.  I feel this is reasonable.  CMP shows normal BUN and creatinine.  Normal LFTs.  Lipase  is unremarkable.  CBC shows a leukocytosis of 24.2.  Initial Covid is negative.  Discussed results with patient.  She does report feeling better.  She was able to tolerate some ice and states she has not felt nauseous.  I discussed with her regarding the leukocytosis.  We will plan to get a CT of the pelvis to ensure that there is no acute abnormality.  CT on pelvis shows a complex loculated fluid collection within the right lower quadrant measuring 9.6 x 4.2 cm extending from the distal cecal tip which is most likely periappendiceal or pericolonic abscess with perforation.  Discussed patient with Dr. Margaree Mackintosh (Gen Surg). He will admit the patient. IV abx started.   Updated patient on plan. She is agreeable.   Anna Blake was evaluated in Emergency Department on 06/03/2019 for the symptoms described in the history of present illness. She was evaluated in the context of the global COVID-19 pandemic, which necessitated consideration that the patient might be at risk for infection with the SARS-CoV-2 virus that causes COVID-19. Institutional protocols and algorithms that pertain to the evaluation of patients at risk for COVID-19 are in a state of rapid change based on information released by regulatory bodies including the CDC and federal and state organizations. These policies and algorithms were followed during the patient's care in the ED.  Portions of this note were generated with Scientist, clinical (histocompatibility and immunogenetics). Dictation errors may occur despite best attempts at proofreading.   Final Clinical Impression(s) / ED Diagnoses Final diagnoses:  Intra-abdominal abscess [facility])  Pelvic abscess in female    Rx / DC Orders ED Discharge Orders    None       Rosana Hoes 06/03/19 2236    Charlynne Pander, MD 06/04/19 1455

## 2019-06-03 NOTE — ED Triage Notes (Signed)
Pt reports n/v/d after eating chick fil a earlier today, pt also reports headache that started yesterday. Pt denies abd pain, slight sore throat but thinks it couild be due to the vomiting. Pt a.o, nad noted

## 2019-06-04 ENCOUNTER — Encounter (HOSPITAL_COMMUNITY): Payer: Self-pay

## 2019-06-04 DIAGNOSIS — R112 Nausea with vomiting, unspecified: Secondary | ICD-10-CM

## 2019-06-04 DIAGNOSIS — K581 Irritable bowel syndrome with constipation: Secondary | ICD-10-CM

## 2019-06-04 DIAGNOSIS — R197 Diarrhea, unspecified: Secondary | ICD-10-CM

## 2019-06-04 LAB — CBC
HCT: 39.9 % (ref 36.0–46.0)
Hemoglobin: 13.2 g/dL (ref 12.0–15.0)
MCH: 30.3 pg (ref 26.0–34.0)
MCHC: 33.1 g/dL (ref 30.0–36.0)
MCV: 91.7 fL (ref 80.0–100.0)
Platelets: 253 10*3/uL (ref 150–400)
RBC: 4.35 MIL/uL (ref 3.87–5.11)
RDW: 11.9 % (ref 11.5–15.5)
WBC: 13.8 10*3/uL — ABNORMAL HIGH (ref 4.0–10.5)
nRBC: 0 % (ref 0.0–0.2)

## 2019-06-04 LAB — BASIC METABOLIC PANEL
Anion gap: 9 (ref 5–15)
BUN: 9 mg/dL (ref 6–20)
CO2: 25 mmol/L (ref 22–32)
Calcium: 9 mg/dL (ref 8.9–10.3)
Chloride: 106 mmol/L (ref 98–111)
Creatinine, Ser: 0.84 mg/dL (ref 0.44–1.00)
GFR calc Af Amer: >60 mL/min (ref >60)
GFR calc non Af Amer: >60 mL/min (ref >60)
Glucose, Bld: 94 mg/dL (ref 70–99)
Potassium: 4.1 mmol/L (ref 3.5–5.1)
Sodium: 140 mmol/L (ref 135–145)

## 2019-06-04 LAB — HIV ANTIBODY (ROUTINE TESTING W REFLEX): HIV Screen 4th Generation wRfx: NONREACTIVE

## 2019-06-04 LAB — SARS CORONAVIRUS 2 (TAT 6-24 HRS): SARS Coronavirus 2: NEGATIVE

## 2019-06-04 MED ORDER — PROCHLORPERAZINE EDISYLATE 10 MG/2ML IJ SOLN: 5.0000 mg | INTRAMUSCULAR | Status: DC | PRN

## 2019-06-04 MED ORDER — ALUM & MAG HYDROXIDE-SIMETH 200-200-20 MG/5ML PO SUSP: 30.0000 mL | Freq: Four times a day (QID) | ORAL | Status: DC | PRN

## 2019-06-04 MED ORDER — PSYLLIUM 95 % PO PACK
1.0000 | PACK | Freq: Two times a day (BID) | ORAL | Status: DC
  Filled 2019-06-04 (×3): qty 1

## 2019-06-04 MED ORDER — SODIUM CHLORIDE 0.9 % IV SOLN: 250.0000 mL | INTRAVENOUS | Status: DC | PRN

## 2019-06-04 MED ORDER — SODIUM CHLORIDE 0.9% FLUSH: 3.0000 mL | INTRAVENOUS | Status: DC | PRN

## 2019-06-04 MED ORDER — BISACODYL 10 MG RE SUPP: 10.0000 mg | Freq: Two times a day (BID) | RECTAL | Status: DC | PRN

## 2019-06-04 MED ORDER — HYDROCORTISONE (PERIANAL) 2.5 % EX CREA
1.0000 "application " | TOPICAL_CREAM | Freq: Four times a day (QID) | CUTANEOUS | Status: DC | PRN
  Filled 2019-06-04: qty 28.35

## 2019-06-04 MED ORDER — METHOCARBAMOL 1000 MG/10ML IJ SOLN
1000.0000 mg | Freq: Four times a day (QID) | INTRAVENOUS | Status: DC | PRN
  Filled 2019-06-04: qty 10

## 2019-06-04 MED ORDER — PHENOL 1.4 % MT LIQD: 1.0000 | OROMUCOSAL | Status: DC | PRN

## 2019-06-04 MED ORDER — HYDROCORTISONE 1 % EX CREA
1.0000 "application " | TOPICAL_CREAM | Freq: Three times a day (TID) | CUTANEOUS | Status: DC | PRN
  Filled 2019-06-04: qty 28

## 2019-06-04 MED ORDER — SODIUM CHLORIDE 0.9% FLUSH
3.0000 mL | Freq: Two times a day (BID) | INTRAVENOUS | Status: DC
  Administered 2019-06-04: 22:00:00 3 mL via INTRAVENOUS

## 2019-06-04 MED ORDER — LACTATED RINGERS IV BOLUS: 1000.0000 mL | Freq: Three times a day (TID) | INTRAVENOUS | Status: DC | PRN

## 2019-06-04 MED ORDER — LIP MEDEX EX OINT
1.0000 "application " | TOPICAL_OINTMENT | Freq: Two times a day (BID) | CUTANEOUS | Status: DC
  Administered 2019-06-04 (×2): 1 via TOPICAL
  Filled 2019-06-04: qty 7

## 2019-06-04 MED ORDER — ONDANSETRON HCL 4 MG/2ML IJ SOLN: 4.0000 mg | Freq: Four times a day (QID) | INTRAMUSCULAR | Status: DC | PRN

## 2019-06-04 MED ORDER — SODIUM CHLORIDE 0.9 % IV SOLN
8.0000 mg | Freq: Four times a day (QID) | INTRAVENOUS | Status: DC | PRN
  Filled 2019-06-04: qty 4

## 2019-06-04 MED ORDER — MENTHOL 3 MG MT LOZG: 1.0000 | LOZENGE | OROMUCOSAL | Status: DC | PRN

## 2019-06-04 MED ORDER — MAGIC MOUTHWASH
15.0000 mL | Freq: Four times a day (QID) | ORAL | Status: DC | PRN
  Filled 2019-06-04: qty 15

## 2019-06-04 MED ORDER — GUAIFENESIN-DM 100-10 MG/5ML PO SYRP: 10.0000 mL | ORAL_SOLUTION | ORAL | Status: DC | PRN

## 2019-06-04 NOTE — Plan of Care (Signed)

## 2019-06-04 NOTE — Progress Notes (Signed)
Received patient from Ed via wheelchair, AOx4, ambulatory, VSS, pain at 2/10, oriented to room, bed controls, call light and plan of care.  Will monitor.

## 2019-06-04 NOTE — Progress Notes (Signed)
Anna Blake [id number] 1986-03-15  CARE TEAM:  PCP: Baruch Gouty, FNP  Outpatient Care Team: Patient Care Team: Baruch Gouty, FNP as PCP - General (Family Medicine)  Inpatient Treatment Team: Treatment Team: Attending Provider: Nolon Nations, MD; Rounding Team: Edison Pace, Md, MD; Registered Nurse: Donzetta Matters, RN; Registered Nurse: Racheal Patches, RN; Respiratory Therapist: Hansel Feinstein, RRT; Case Manager: Claudie Leach, RN; Social Worker: Gabrielle Dare   Problem List:   Principal Problem:   Gastroenteritis Active Problems:   History of bariatric surgery   Gastroesophageal reflux disease without esophagitis   GAD (generalized anxiety disorder)   BMI 36.0-36.9,adult   Nausea with vomiting   Diarrhea   Irritable bowel syndrome with predominant constipation      * No surgery found *      Assessment  Nausea vomiting and now diarrhea improved.  Re-elevation more consistent with gastroenteritis with dilated cecal bascule.  No true abscess  [facility] Stay = 1 days)  Plan:  -Clear diet.  Advance to solid diet as tolerated.  Stop IV fluids.  Serial exams.  Hopefully if she continues to improve rapidly can leave in the morning.  We will see.  -VTE prophylaxis- SCDs, etc  -mobilize as tolerated to help recovery  25 minutes spent in review, evaluation, examination, counseling, and coordination of care.  More than 50% of that time was spent in counseling.  I reviewed the films and agree with interventional radiology that patient has an stretched out cecal bascule that is laying on the bladder and uterus.  There is no wall thickening or inflammation suspicious for cecal-itis/colitis.  Appendix can be seen and is not inflamed.  There is no phlegmon.  She is nontender there.  That argues against a true abscess.  06/04/2019    Subjective: (Chief complaint)  Feeling much better overall.  No emesis since being admitted.  Nausea less.  Getting  hungry.  Loose bowel movements.  Patient notes she struggles with what sounds like a regular irritable bowels.  Often constipated and needing Colace which gives her diarrhea.  History of gastric sleeve resection 2014 and then cholecystectomy.  No recent abdominal surgeries.  She denies really any sick contacts.  No rectal bleeding.  Hoping to go home soon since she is feeling better  Objective:  Vital signs:  Vitals:   06/03/19 2305 06/03/19 2330 06/04/19 0514 06/04/19 1021  BP: 125/86  115/78 107/70  Pulse: 99  79 76  Resp: 18  18 18   Temp: 98.2 F (36.8 C)  98.2 F (36.8 C) 98.4 F (36.9 C)  TempSrc: Oral  Oral Oral  SpO2: 100%  98% 98%  Weight:  90.1 kg    Height:  5' 2"  (1.575 m)      Last BM Date: 06/04/19  Intake/Output   Yesterday:  02/06 0701 - 02/07 0700 In: 721.5 [I.V.:667.3; IV Piggyback:54.2] Out: -  This shift:  No intake/output data recorded.  Bowel function:  Flatus: YES  BM:  YES  Drain: (No drain)   Physical Exam:  General: Pt awake/alert in no acute distress Eyes: PERRL, normal EOM.  Sclera clear.  No icterus Neuro: CN II-XII intact w/o focal sensory/motor deficits. Lymph: No head/neck/groin lymphadenopathy Psych:  No delerium/psychosis/paranoia.  Oriented x 4 HENT: Normocephalic, Mucus membranes moist.  No thrush Neck: Supple, No tracheal deviation.  No obvious thyromegaly Chest: No pain to chest wall compression.  Good respiratory excursion.  No audible wheezing CV:  Pulses intact.  Regular  rhythm.  No major extremity edema MS: Normal AROM mjr joints.  No obvious deformity  Abdomen: Soft.  Mildy distended.  Mild discomfort periumbilically.  No tenderness over McBurney's point.  No Murphy sign.  No Rosvig.  No guarding..  No evidence of peritonitis.  No incarcerated hernias.  Ext:   No deformity.  No mjr edema.  No cyanosis Skin: No petechiae / purpurea.  No major sores.  Warm and dry    Results:   Cultures: Recent Results (from  the past 720 hour(s))  SARS CORONAVIRUS 2 (TAT 6-24 HRS) Nasopharyngeal Nasopharyngeal Swab     Status: None   Collection Time: 06/03/19  7:30 PM   Specimen: Nasopharyngeal Swab  Result Value Ref Range Status   SARS Coronavirus 2 NEGATIVE NEGATIVE Final    Comment: (NOTE) SARS-CoV-2 target nucleic acids are NOT DETECTED. The SARS-CoV-2 RNA is generally detectable in upper and lower respiratory specimens during the acute phase of infection. Negative results do not preclude SARS-CoV-2 infection, do not rule out co-infections with other pathogens, and should not be used as the sole basis for treatment or other patient management decisions. Negative results must be combined with clinical observations, patient history, and epidemiological information. The expected result is Negative. Fact Sheet for Patients: SugarRoll.be Fact Sheet for Healthcare Providers: https://www.woods-mathews.com/ This test is not yet approved or cleared by the [place] FDA and  has been authorized for detection and/or diagnosis of SARS-CoV-2 by FDA under an Emergency Use Authorization (EUA). This EUA will remain  in effect (meaning this test can be used) for the duration of the COVID-19 declaration under Section 56 4(b)(1) of the Act, 21 U.S.C. section 360bbb-3(b)(1), unless the authorization is terminated or revoked sooner. Performed at [facility], [place] [address], [place], [place] [address]     Labs: Results for orders placed or performed during the hospital encounter of 06/03/19 (from the past 48 hour(s))  Comprehensive metabolic panel     Status: Abnormal   Collection Time: 06/03/19  6:48 PM  Result Value Ref Range   Sodium 138 135 - 145 mmol/L   Potassium 4.3 3.5 - 5.1 mmol/L   Chloride 104 98 - 111 mmol/L   CO2 23 22 - 32 mmol/L   Glucose, Bld 127 (H) 70 - 99 mg/dL   BUN 12 6 - 20 mg/dL   Creatinine, Ser 0.85 0.44 - 1.00 mg/dL   Calcium 9.5 8.9 -  10.3 mg/dL   Total Protein 7.1 6.5 - 8.1 g/dL   Albumin 4.2 3.5 - 5.0 g/dL   AST 20 15 - 41 U/L   ALT 22 0 - 44 U/L   Alkaline Phosphatase 107 38 - 126 U/L   Total Bilirubin 0.6 0.3 - 1.2 mg/dL   GFR calc non Af Amer >60 >60 mL/min   GFR calc Af Amer >60 >60 mL/min   Anion gap 11 5 - 15    Comment: Performed at [facility], [address], [place], [place] [address]  CBC with Differential     Status: Abnormal   Collection Time: 06/03/19  6:48 PM  Result Value Ref Range   WBC 24.2 (H) 4.0 - 10.5 K/uL   RBC 4.94 3.87 - 5.11 MIL/uL   Hemoglobin 15.1 (H) 12.0 - 15.0 g/dL   HCT 45.7 36.0 - 46.0 %   MCV 92.5 80.0 - 100.0 fL   MCH 30.6 26.0 - 34.0 pg   MCHC 33.0 30.0 - 36.0 g/dL   RDW  11.9 11.5 - 15.5 %   Platelets 289 150 - 400 K/uL   nRBC 0.0 0.0 - 0.2 %   Neutrophils Relative % 89 %   Neutro Abs 21.5 (H) 1.7 - 7.7 K/uL   Lymphocytes Relative 5 %   Lymphs Abs 1.3 0.7 - 4.0 K/uL   Monocytes Relative 4 %   Monocytes Absolute 1.0 0.1 - 1.0 K/uL   Eosinophils Relative 1 %   Eosinophils Absolute 0.3 0.0 - 0.5 K/uL   Basophils Relative 0 %   Basophils Absolute 0.1 0.0 - 0.1 K/uL   Immature Granulocytes 1 %   Abs Immature Granulocytes 0.16 (H) 0.00 - 0.07 K/uL    Comment: Performed at [facility] [address], [place], [place] [address]  Lipase, blood     Status: None   Collection Time: 06/03/19  6:48 PM  Result Value Ref Range   Lipase 24 11 - 51 U/L    Comment: Performed at [facility] [address] [place], [place] [address]  I-Stat beta hCG blood, ED     Status: None   Collection Time: 06/03/19  6:56 PM  Result Value Ref Range   I-stat hCG, quantitative <5.0 <5 mIU/mL   Comment 3            Comment:   GEST. AGE      CONC.  (mIU/mL)   <=1 WEEK        5 - 50     2 WEEKS       50 - 500     3 WEEKS       100 - 10,000     4 WEEKS     1,000 - 30,000        FEMALE AND NON-PREGNANT FEMALE:     LESS THAN 5 mIU/mL   POC SARS Coronavirus 2 Ag-ED - Nasal  Swab (BD Veritor Kit)     Status: None   Collection Time: 06/03/19  7:26 PM  Result Value Ref Range   SARS Coronavirus 2 Ag NEGATIVE NEGATIVE    Comment: (NOTE) SARS-CoV-2 antigen NOT DETECTED.  Negative results are presumptive.  Negative results do not preclude SARS-CoV-2 infection and should not be used as the sole basis for treatment or other patient management decisions, including infection  control decisions, particularly in the presence of clinical signs and  symptoms consistent with COVID-19, or in those who have been in contact with the virus.  Negative results must be combined with clinical observations, patient history, and epidemiological information. The expected result is Negative. Fact Sheet for Patients: PodPark.tn Fact Sheet for Healthcare Providers: GiftContent.is This test is not yet approved or cleared by the [place] FDA and  has been authorized for detection and/or diagnosis of SARS-CoV-2 by FDA under an Emergency Use Authorization (EUA).  This EUA will remain in effect (meaning this test can be used) for the duration of  the COVID-19 de claration under Section 564(b)(1) of the Act, 21 U.S.C. section 360bbb-3(b)(1), unless the authorization is terminated or revoked sooner.   SARS CORONAVIRUS 2 (TAT 6-24 HRS) Nasopharyngeal Nasopharyngeal Swab     Status: None   Collection Time: 06/03/19  7:30 PM   Specimen: Nasopharyngeal Swab  Result Value Ref Range   SARS Coronavirus 2 NEGATIVE NEGATIVE    Comment: (NOTE) SARS-CoV-2 target nucleic acids are NOT DETECTED. The SARS-CoV-2 RNA is generally detectable in upper and lower respiratory specimens during the acute phase of infection. Negative results  do not preclude SARS-CoV-2 infection, do not rule out co-infections with other pathogens, and should not be used as the sole basis for treatment or other patient management decisions. Negative results must be  combined with clinical observations, patient history, and epidemiological information. The expected result is Negative. Fact Sheet for Patients: SugarRoll.be Fact Sheet for Healthcare Providers: https://www.woods-mathews.com/ This test is not yet approved or cleared by the [place] FDA and  has been authorized for detection and/or diagnosis of SARS-CoV-2 by FDA under an Emergency Use Authorization (EUA). This EUA will remain  in effect (meaning this test can be used) for the duration of the COVID-19 declaration under Section 56 4(b)(1) of the Act, 21 U.S.C. section 360bbb-3(b)(1), unless the authorization is terminated or revoked sooner. Performed at [facility], [place] [address] [place], [place] [address]   Urinalysis, Routine w reflex microscopic     Status: Abnormal   Collection Time: 06/03/19  7:47 PM  Result Value Ref Range   Color, Urine YELLOW YELLOW   APPearance HAZY (A) CLEAR   Specific Gravity, Urine 1.033 (H) 1.005 - 1.030   pH 5.0 5.0 - 8.0   Glucose, UA NEGATIVE NEGATIVE mg/dL   Hgb urine dipstick NEGATIVE NEGATIVE   Bilirubin Urine NEGATIVE NEGATIVE   Ketones, ur 20 (A) NEGATIVE mg/dL   Protein, ur 30 (A) NEGATIVE mg/dL   Nitrite NEGATIVE NEGATIVE   Leukocytes,Ua NEGATIVE NEGATIVE   RBC / HPF 0-5 0 - 5 RBC/hpf   WBC, UA 0-5 0 - 5 WBC/hpf   Bacteria, UA RARE (A) NONE SEEN   Squamous Epithelial / LPF 0-5 0 - 5   Mucus PRESENT     Comment: Performed at [facility], [place] [address] [place], [place] [address]  Basic metabolic panel     Status: None   Collection Time: 06/04/19  3:32 AM  Result Value Ref Range   Sodium 140 135 - 145 mmol/L   Potassium 4.1 3.5 - 5.1 mmol/L   Chloride 106 98 - 111 mmol/L   CO2 25 22 - 32 mmol/L   Glucose, Bld 94 70 - 99 mg/dL   BUN 9 6 - 20 mg/dL   Creatinine, Ser 0.84 0.44 - 1.00 mg/dL   Calcium 9.0 8.9 - 10.3 mg/dL   GFR calc non Af Amer >60 >60 mL/min   GFR calc Af Amer  >60 >60 mL/min   Anion gap 9 5 - 15    Comment: Performed at [facility] [address] [place], [place] [address]  CBC     Status: Abnormal   Collection Time: 06/04/19  3:32 AM  Result Value Ref Range   WBC 13.8 (H) 4.0 - 10.5 K/uL   RBC 4.35 3.87 - 5.11 MIL/uL   Hemoglobin 13.2 12.0 - 15.0 g/dL   HCT 39.9 36.0 - 46.0 %   MCV 91.7 80.0 - 100.0 fL   MCH 30.3 26.0 - 34.0 pg   MCHC 33.1 30.0 - 36.0 g/dL   RDW 11.9 11.5 - 15.5 %   Platelets 253 150 - 400 K/uL   nRBC 0.0 0.0 - 0.2 %    Comment: Performed at [facility], [place] [address], [place], [place] [address]    Imaging / Studies: CT ABDOMEN PELVIS W CONTRAST  Addendum Date: 06/04/2019   ADDENDUM REPORT: 06/04/2019 09:06 ADDENDUM: Addendum created after patient was referred for evaluation of possible image guided drainage of the abscess. FINDINGS: The length of the colon  is fluid-filled, and this includes the cecum. Coronal images best demonstrate the cecum on top of the right broad ligament/ovary, overlying the uterus. Normal ileocecal valve is identified on image 48 of series 6. A normal appendix is visualized on image sequence 54-61 of series 6. There is no appendicolith. No inflammatory changes within the adjacent fat. No extraluminal air. IMPRESSION: The CT is negative for acute appendicitis, within normal appendix visualized, and negative for pelvic abscess, with a fluid-filled right colon/cecum. The findings most likely represent a nonspecific enteritis/colitis. These results were discussed with Ms Margie Billet, PA, at the time of interpretation on 06/04/2019 at 9:05 am. Signed, Dulcy Fanny. Dellia Nims, RPVI [facility] Radiology Electronically Signed   By: Corrie Mckusick D.O.   On: 06/04/2019 09:06   Result Date: 06/04/2019 CLINICAL DATA:  Abdominal pain EXAM: CT ABDOMEN AND PELVIS WITH CONTRAST TECHNIQUE: Multidetector CT imaging of the abdomen and pelvis was performed using the  standard protocol following bolus administration of intravenous contrast. CONTRAST:  114m OMNIPAQUE IOHEXOL 300 MG/ML  SOLN COMPARISON:  None. FINDINGS: Lower chest: The visualized heart size within normal limits. No pericardial fluid/thickening. No hiatal hernia. The visualized portions of the lungs are clear. Hepatobiliary: The liver is normal in density without focal abnormality.The main portal vein is patent. No evidence of calcified gallstones, gallbladder wall thickening or biliary dilatation. Pancreas: Unremarkable. No pancreatic ductal dilatation or surrounding inflammatory changes. Spleen: Normal in size without focal abnormality. Adrenals/Urinary Tract: Both adrenal glands appear normal. The kidneys and collecting system appear normal without evidence of urinary tract calculus or hydronephrosis. Bladder is decompressed. Stomach/Bowel: The patient is status post gastric sleeve resection. A small hiatal hernia is present with food debris. The small bowel is unremarkable. Within the right lower quadrant there is a complex fluid collection with small foci of air seen within it measuring 9.6 x 4.2 cm extending from the distal cecal tip, which is likely due to perforated appendicitis. There is a small tubular air-filled structure seen within right lower quadrant, series 3, image 61 which could be a tiny focal portion of the appendix. Vascular/Lymphatic: There are no enlarged mesenteric, retroperitoneal, or pelvic lymph nodes. No significant vascular findings are present. Reproductive: IUD is seen within the endometrial canal. Within the left adnexa there is a 4.1 cm low-density lesion, likely ovarian cyst. Other: No evidence of abdominal wall mass or hernia. Musculoskeletal: No acute or significant osseous findings. IMPRESSION: Complex loculated fluid collection within the right lower quadrant measuring 9.6 x 4.2 cm extending from the the distal cecal tip which is likely periappendiceal or pericolonic abscess  with perforation. Electronically Signed: By: BPrudencio PairM.D. On: 06/03/2019 20:34    Medications / Allergies: per chart  Antibiotics: Anti-infectives (From admission, onward)   Start     Dose/Rate Route Frequency Ordered Stop   06/04/19 0600  piperacillin-tazobactam (ZOSYN) IVPB 3.375 g     3.375 g 12.5 mL/hr over 240 Minutes Intravenous Every 8 hours 06/03/19 2050     06/03/19 2045  piperacillin-tazobactam (ZOSYN) IVPB 3.375 g     3.375 g 100 mL/hr over 30 Minutes Intravenous  Once 06/03/19 2039 06/03/19 2141        Note: Portions of this report may have been transcribed using voice recognition software. Every effort was made to ensure accuracy; however, inadvertent computerized transcription errors may be present.   Any transcriptional errors that result from this process are unintentional.     SAdin Hector MD,  FACS, MASCRS Gastrointestinal and Minimally Invasive Surgery    [address] [place], [place] [address] [phone] Main / Paging [phone] Fax Please see Amion for pager number, especial 5pm - 7am.

## 2019-06-05 ENCOUNTER — Encounter: Payer: Self-pay | Admitting: Family Medicine

## 2019-06-05 MED ORDER — ACETAMINOPHEN 325 MG PO TABS: 650.0000 mg | ORAL_TABLET | Freq: Four times a day (QID) | ORAL | Status: AC | PRN

## 2019-06-05 MED ORDER — SACCHAROMYCES BOULARDII 250 MG PO CAPS: 250.0000 mg | ORAL_CAPSULE | Freq: Two times a day (BID) | ORAL | 0 refills | Status: AC

## 2019-06-05 MED ORDER — FLUCONAZOLE 100 MG PO TABS: 100.0000 mg | ORAL_TABLET | ORAL | 0 refills | Status: AC | PRN

## 2019-06-05 MED ORDER — AMOXICILLIN-POT CLAVULANATE 875-125 MG PO TABS: 1.0000 | ORAL_TABLET | Freq: Two times a day (BID) | ORAL | 0 refills | Status: AC

## 2019-06-05 NOTE — Discharge Summary (Signed)
[facility] Discharge Summary   Patient ID: Anna Blake MRN: [id number] DOB/AGE: Nov 08, 1985 34 y.o.  Admit date: 06/03/2019 Discharge date: 06/05/2019  Admitting Diagnosis: Pelvic abscess  Discharge Diagnosis Colitis  Consultants Interventional radiology  Imaging: CT ABDOMEN PELVIS W CONTRAST  Addendum Date: 06/04/2019   ADDENDUM REPORT: 06/04/2019 09:06 ADDENDUM: Addendum created after patient was referred for evaluation of possible image guided drainage of the abscess. FINDINGS: The length of the colon is fluid-filled, and this includes the cecum. Coronal images best demonstrate the cecum on top of the right broad ligament/ovary, overlying the uterus. Normal ileocecal valve is identified on image 48 of series 6. A normal appendix is visualized on image sequence 54-61 of series 6. There is no appendicolith. No inflammatory changes within the adjacent fat. No extraluminal air. IMPRESSION: The CT is negative for acute appendicitis, within normal appendix visualized, and negative for pelvic abscess, with a fluid-filled right colon/cecum. The findings most likely represent a nonspecific enteritis/colitis. These results were discussed with Anna Carlena Bjornstad, PA, at the time of interpretation on 06/04/2019 at 9:05 am. Signed, Yvone Neu. Reyne Dumas, RPVI [facility] Radiology Electronically Signed   By: Gilmer Mor D.O.   On: 06/04/2019 09:06   Result Date: 06/04/2019 CLINICAL DATA:  Abdominal pain EXAM: CT ABDOMEN AND PELVIS WITH CONTRAST TECHNIQUE: Multidetector CT imaging of the abdomen and pelvis was performed using the standard protocol following bolus administration of intravenous contrast. CONTRAST:  OMNIPAQUE IOHEXOL 300 MG/ML  SOLN COMPARISON:  None. FINDINGS: Lower chest: The visualized heart size within normal limits. No pericardial fluid/thickening. No hiatal hernia. The visualized portions of the lungs are clear. Hepatobiliary:  The liver is normal in density without focal abnormality.The main portal vein is patent. No evidence of calcified gallstones, gallbladder wall thickening or biliary dilatation. Pancreas: Unremarkable. No pancreatic ductal dilatation or surrounding inflammatory changes. Spleen: Normal in size without focal abnormality. Adrenals/Urinary Tract: Both adrenal glands appear normal. The kidneys and collecting system appear normal without evidence of urinary tract calculus or hydronephrosis. Bladder is decompressed. Stomach/Bowel: The patient is status post gastric sleeve resection. A small hiatal hernia is present with food debris. The small bowel is unremarkable. Within the right lower quadrant there is a complex fluid collection with small foci of air seen within it measuring 9.6 x 4.2 cm extending from the distal cecal tip, which is likely due to perforated appendicitis. There is a small tubular air-filled structure seen within right lower quadrant, series 3, image 61 which could be a tiny focal portion of the appendix. Vascular/Lymphatic: There are no enlarged mesenteric, retroperitoneal, or pelvic lymph nodes. No significant vascular findings are present. Reproductive: IUD is seen within the endometrial canal. Within the left adnexa there is a 4.1 cm low-density lesion, likely ovarian cyst. Other: No evidence of abdominal wall mass or hernia. Musculoskeletal: No acute or significant osseous findings. IMPRESSION: Complex loculated fluid collection within the right lower quadrant measuring 9.6 x 4.2 cm extending from the the distal cecal tip which is likely periappendiceal or pericolonic abscess with perforation. Electronically Signed: By: Jonna Clark M.D. On: 06/03/2019 20:34    Procedures None  Hospital Course:  Patient is a 34 year old female who presented to [facility] with abdominal pain, nausea, vomiting, and diarrhea.  Workup showed what appeared to be pelvic abscess.  Patient was admitted and started on IV  antibiotics and IR consulted for percutaneous drainage. On further examination of initial CT with clinical picture it  was determined that there was actually no abscess and patient had colitis with a dilated cecal bascule.  Diet was advanced as tolerated.  On 06/05/19, the patient was voiding well, tolerating diet, ambulating well, pain resolved, vital signs stable, and felt stable for discharge home.  Patient will follow up with PCP as needed.    Physical Exam: General: pleasant, WD, WN white female who is laying in bed in NAD HEENT:  Sclera are noninjected.  PERRL.  Ears and nose without any masses or lesions.  Mouth is pink and moist Heart: regular, rate, and rhythm.  Normal s1,s2. No obvious murmurs, gallops, or rubs noted.  Palpable radial and pedal pulses bilaterally Lungs: CTAB, no wheezes, rhonchi, or rales noted.  Respiratory effort nonlabored Abd: soft, NT, ND, +BS, no masses, hernias, or organomegaly Anna: all 4 extremities are symmetrical with no cyanosis, clubbing, or edema. Skin: warm and dry with no masses, lesions, or rashes Neuro: Cranial nerves 2-12 grossly intact, speech is normal Psych: A&Ox3 with an appropriate affect.   Allergies as of 06/05/2019      Reactions   Z-pak [azithromycin] Hives      Medication List    STOP taking these medications   docusate sodium 100 MG capsule Commonly known as: COLACE   orlistat 120 MG capsule Commonly known as: XENICAL     TAKE these medications   acetaminophen 325 MG tablet Commonly known as: TYLENOL Take 2 tablets (650 mg total) by mouth every 6 (six) hours as needed for mild pain (or temp > 100).   amoxicillin-clavulanate 875-125 MG tablet Commonly known as: Augmentin Take 1 tablet by mouth every 12 (twelve) hours for 10 days.   Biotin 1000 MCG tablet Take 1,000 mcg by mouth daily.   buPROPion 150 MG 24 hr tablet Commonly known as: WELLBUTRIN XL TAKE 2 TABLETS BY MOUTH IN THE MORNING What changed:   how much to  take  how to take this  when to take this  additional instructions   fluconazole 100 MG tablet Commonly known as: Diflucan Take 1 tablet (100 mg total) by mouth every 3 (three) days as needed for up to 4 doses.   FLUoxetine 40 MG capsule Commonly known as: PROZAC Take 1 capsule (40 mg total) by mouth daily.   ibuprofen 200 MG tablet Commonly known as: ADVIL Take 400 mg by mouth daily as needed for headache.   pantoprazole 40 MG tablet Commonly known as: PROTONIX Take 1 tablet (40 mg total) by mouth 2 (two) times daily.   saccharomyces boulardii 250 MG capsule Commonly known as: Florastor Take 1 capsule (250 mg total) by mouth 2 (two) times daily for 10 days. Separate from antibiotic by at least 2 hrs        Follow-up Information    Rakes, Connye Burkitt, FNP. Call.   Specialty: Family Medicine Why: Call as needed for continued GI symptoms related to colitis.  Contact information: [place] [address] [phone]        Surgery, [place]. Call.   Specialty: General Surgery Why: Call as needed, no follow up scheduled.  Contact information: [place] [address] [phone]           Signed: Brigid Re, [facility] 06/05/2019, 8:23 AM Please see Amion for pager number during day hours 7:00am-4:30pm

## 2019-06-05 NOTE — Discharge Instructions (Signed)
Colitis  Colitis is inflammation of the colon. Colitis may last a short time (be acute), or it may last a long time (become chronic). What are the causes? This condition may be caused by:  Viruses.  Bacteria.  Reaction to medicine.  Certain autoimmune diseases such as Crohn's disease or ulcerative colitis.  Radiation treatment.  Decreased blood flow to the bowel (ischemia). What are the signs or symptoms? Symptoms of this condition include:  Watery diarrhea.  Passing bloody or tarry stool.  Pain.  Fever.  Vomiting.  Tiredness (fatigue).  Weight loss.  Bloating.  Abdominal pain.  Having fewer bowel movements than usual.  A strong and sudden urge to have a bowel movement.  Feeling like the bowel is not empty after a bowel movement. How is this diagnosed? This condition is diagnosed with a stool test or a blood test. You may also have other tests, such as:  X-rays.  CT scan.  Colonoscopy.  Endoscopy.  Biopsy. How is this treated? Treatment for this condition depends on the cause. The condition may be treated by:  Resting the bowel. This involves not eating or drinking for a period of time.  Fluids that are given through an IV.  Medicine for pain and diarrhea.  Antibiotic medicines.  Cortisone medicines.  Surgery. Follow these instructions at home: Eating and drinking   Follow instructions from your health care provider about eating or drinking restrictions.  Drink enough fluid to keep your urine pale yellow.  Work with a dietitian to determine which foods cause your condition to flare up.  Avoid foods that cause flare-ups.  Eat a well-balanced diet. General instructions  If you were prescribed an antibiotic medicine, take it as told by your health care provider. Do not stop taking the antibiotic even if you start to feel better.  Take over-the-counter and prescription medicines only as told by your health care provider.  Keep all  follow-up visits as told by your health care provider. This is important. Contact a health care provider if:  Your symptoms do not go away.  You develop new symptoms. Get help right away if you:  Have a fever that does not go away with treatment.  Develop chills.  Have extreme weakness, fainting, or dehydration.  Have repeated vomiting.  Develop severe pain in your abdomen.  Pass bloody or tarry stool. Summary  Colitis is inflammation of the colon. Colitis may last a short time (be acute), or it may last a long time (become chronic).  Treatment for this condition depends on the cause and may include resting the bowel, taking medicines, or having surgery.  If you were prescribed an antibiotic medicine, take it as told by your health care provider. Do not stop taking the antibiotic even if you start to feel better.  Get help right away if you develop severe pain in your abdomen.  Keep all follow-up visits as told by your health care provider. This is important. This information is not intended to replace advice given to you by your health care provider. Make sure you discuss any questions you have with your health care provider. Document Revised: 10/14/2017 Document Reviewed: 10/14/2017 Elsevier Patient Education  2020 [employer].    Food Choices to Help Relieve Diarrhea, Adult When you have diarrhea, the foods you eat and your eating habits are very important. Choosing the right foods and drinks can help:  Relieve diarrhea.  Replace lost fluids and nutrients.  Prevent dehydration. What general guidelines should I follow?  Relieving diarrhea  Choose foods with less than 2 g or .07 oz. of fiber per serving.  Limit fats to less than 8 tsp (38 g or 1.34 oz.) a day.  Avoid the following: ? Foods and beverages sweetened with high-fructose corn syrup, honey, or sugar alcohols such as xylitol, sorbitol, and mannitol. ? Foods that contain a lot of fat or  sugar. ? Fried, greasy, or spicy foods. ? High-fiber grains, breads, and cereals. ? Raw fruits and vegetables.  Eat foods that are rich in probiotics. These foods include dairy products such as yogurt and fermented milk products. They help increase healthy bacteria in the stomach and intestines (gastrointestinal tract, or GI tract).  If you have lactose intolerance, avoid dairy products. These may make your diarrhea worse.  Take medicine to help stop diarrhea (antidiarrheal medicine) only as told by your health care provider. Replacing nutrients  Eat small meals or snacks every 3-4 hours.  Eat bland foods, such as white rice, toast, or baked potato, until your diarrhea starts to get better. Gradually reintroduce nutrient-rich foods as tolerated or as told by your health care provider. This includes: ? Well-cooked protein foods. ? Peeled, seeded, and soft-cooked fruits and vegetables. ? Low-fat dairy products.  Take vitamin and mineral supplements as told by your health care provider. Preventing dehydration  Start by sipping water or a special solution to prevent dehydration (oral rehydration solution, ORS). Urine that is clear or pale yellow means that you are getting enough fluid.  Try to drink at least 8-10 cups of fluid each day to help replace lost fluids.  You may add other liquids in addition to water, such as clear juice or decaffeinated sports drinks, as tolerated or as told by your health care provider.  Avoid drinks with caffeine, such as coffee, tea, or soft drinks.  Avoid alcohol. What foods are recommended?     The items listed may not be a complete list. Talk with your health care provider about what dietary choices are best for you. Grains White rice. White, [place], or pita breads (fresh or toasted), including plain rolls, buns, or bagels. White pasta. Saltine, soda, or graham crackers. Pretzels. Low-fiber cereal. Cooked cereals made with water (such as cornmeal,  farina, or cream cereals). Plain muffins. Matzo. Melba toast. Zwieback. Vegetables Potatoes (without the skin). Most well-cooked and canned vegetables without skins or seeds. Tender lettuce. Fruits Apple sauce. Fruits canned in juice. Cooked apricots, cherries, grapefruit, peaches, pears, or plums. Fresh bananas and cantaloupe. Meats and other protein foods Baked or boiled chicken. Eggs. Tofu. Fish. Seafood. Smooth nut butters. Ground or well-cooked tender beef, ham, veal, lamb, pork, or poultry. Dairy Plain yogurt, kefir, and unsweetened liquid yogurt. Lactose-free milk, buttermilk, skim milk, or soy milk. Low-fat or nonfat hard cheese. Beverages Water. Low-calorie sports drinks. Fruit juices without pulp. Strained tomato and vegetable juices. Decaffeinated teas. Sugar-free beverages not sweetened with sugar alcohols. Oral rehydration solutions, if approved by your health care provider. Seasoning and other foods Bouillon, broth, or soups made from recommended foods. What foods are not recommended? The items listed may not be a complete list. Talk with your health care provider about what dietary choices are best for you. Grains Whole grain, whole wheat, bran, or rye breads, rolls, pastas, and crackers. Wild or brown rice. Whole grain or bran cereals. Barley. Oats and oatmeal. Corn tortillas or taco shells. Granola. Popcorn. Vegetables Raw vegetables. Fried vegetables. Cabbage, broccoli, Brussels sprouts, artichokes, baked beans, beet greens, corn, kale, legumes,  peas, sweet potatoes, and yams. Potato skins. Cooked spinach and cabbage. Fruits Dried fruit, including raisins and dates. Raw fruits. Stewed or dried prunes. Canned fruits with syrup. Meat and other protein foods Fried or fatty meats. Deli meats. Chunky nut butters. Nuts and seeds. Beans and lentils. Berniece Salines. Hot dogs. Sausage. Dairy High-fat cheeses. Whole milk, chocolate milk, and beverages made with milk, such as milk shakes.  Half-and-half. Cream. sour cream. Ice cream. Beverages Caffeinated beverages (such as coffee, tea, soda, or energy drinks). Alcoholic beverages. Fruit juices with pulp. Prune juice. Soft drinks sweetened with high-fructose corn syrup or sugar alcohols. High-calorie sports drinks. Fats and oils Butter. Cream sauces. Margarine. Salad oils. Plain salad dressings. Olives. Avocados. Mayonnaise. Sweets and desserts Sweet rolls, doughnuts, and sweet breads. Sugar-free desserts sweetened with sugar alcohols such as xylitol and sorbitol. Seasoning and other foods Honey. Hot sauce. Chili powder. Gravy. Cream-based or milk-based soups. Pancakes and waffles. Summary  When you have diarrhea, the foods you eat and your eating habits are very important.  Make sure you get at least 8-10 cups of fluid each day, or enough to keep your urine clear or pale yellow.  Eat bland foods and gradually reintroduce healthy, nutrient-rich foods as tolerated, or as told by your health care provider.  Avoid high-fiber, fried, greasy, or spicy foods. This information is not intended to replace advice given to you by your health care provider. Make sure you discuss any questions you have with your health care provider. Document Revised: 08/04/2018 Document Reviewed: 04/10/2016 Elsevier Patient Education  [place].

## 2019-06-05 NOTE — Progress Notes (Signed)
Pt discharged home. RN reviewed all discharge instructions, new meds, follow up appointments, and after care. Pt understands without assistance. All questions answered. Pt down to main entrance in family car. Pt got all belongings including cell phone, clothing, and personal items. RN reminded pt to double check the room for all belongings as [facility] is not responsible for items left behind.

## 2019-06-08 ENCOUNTER — Other Ambulatory Visit: Payer: 59

## 2019-06-08 ENCOUNTER — Encounter: Payer: Self-pay | Admitting: Family Medicine

## 2019-06-08 ENCOUNTER — Other Ambulatory Visit: Payer: Self-pay

## 2019-06-08 ENCOUNTER — Other Ambulatory Visit: Payer: Self-pay | Admitting: Family Medicine

## 2019-06-08 DIAGNOSIS — K219 Gastro-esophageal reflux disease without esophagitis: Secondary | ICD-10-CM

## 2019-06-08 DIAGNOSIS — K529 Noninfective gastroenteritis and colitis, unspecified: Secondary | ICD-10-CM

## 2019-06-09 LAB — CBC WITH DIFFERENTIAL/PLATELET
Basophils Absolute: 0.1 10*3/uL (ref 0.0–0.2)
Basos: 1 %
EOS (ABSOLUTE): 0.3 10*3/uL (ref 0.0–0.4)
Eos: 3 %
Hematocrit: 39.4 % (ref 34.0–46.6)
Hemoglobin: 13.4 g/dL (ref 11.1–15.9)
Immature Grans (Abs): 0.1 10*3/uL (ref 0.0–0.1)
Immature Granulocytes: 1 %
Lymphocytes Absolute: 2.7 10*3/uL (ref 0.7–3.1)
Lymphs: 25 %
MCH: 30.6 pg (ref 26.6–33.0)
MCHC: 34 g/dL (ref 31.5–35.7)
MCV: 90 fL (ref 79–97)
Monocytes Absolute: 0.8 10*3/uL (ref 0.1–0.9)
Monocytes: 7 %
Neutrophils Absolute: 7 10*3/uL (ref 1.4–7.0)
Neutrophils: 63 %
Platelets: 300 10*3/uL (ref 150–450)
RBC: 4.38 x10E6/uL (ref 3.77–5.28)
RDW: 11.7 % (ref 11.7–15.4)
WBC: 10.9 10*3/uL — ABNORMAL HIGH (ref 3.4–10.8)

## 2019-06-13 ENCOUNTER — Encounter: Payer: Self-pay | Admitting: Internal Medicine

## 2019-07-03 ENCOUNTER — Encounter: Payer: Self-pay | Admitting: Gastroenterology

## 2019-07-03 ENCOUNTER — Ambulatory Visit: Payer: 59 | Admitting: Gastroenterology

## 2019-07-03 NOTE — Progress Notes (Deleted)
Primary Care Physician:  Sonny Masters, FNP  Primary Gastroenterologist:    No chief complaint on file.   HPI:  Anna Blake is a 34 y.o. female here at the request of Gilford Silvius, FNP for further evaluation of gastroenteritis, colitis, GERD.  Patient has a history of gastric sleeve in 2014, status post cholecystectomy.  Hospitalization last month with abdominal cramping, vomiting, diarrhea.  New medications including Metformin and "colon cleanse with natural supplements".  Work-up initially concerning for pelvic abscess on CT.  Started on IV antibiotics and IR consulted for percutaneous drainage.  However on further examination of the initial CT and clinical picture, it was determined that patient did not have an abscess and likely had colitis with a dilated cecal bascule.  On admission her white blood cell count was 24,200 and at time of discharge was 13,800.  Repeat white blood cell count 10,900 one week post discharge.  Covid test negative.  Patient rapidly improved.     Current Outpatient Medications  Medication Sig Dispense Refill  . acetaminophen (TYLENOL) 325 MG tablet Take 2 tablets (650 mg total) by mouth every 6 (six) hours as needed for mild pain (or temp > 100).    . Biotin 1000 MCG tablet Take 1,000 mcg by mouth daily.     Marland Kitchen buPROPion (WELLBUTRIN XL) 150 MG 24 hr tablet TAKE 2 TABLETS BY MOUTH IN THE MORNING (Patient taking differently: Take 300 mg by mouth daily. ) 60 tablet 5  . FLUoxetine (PROZAC) 40 MG capsule Take 1 capsule (40 mg total) by mouth daily. 30 capsule 5  . ibuprofen (ADVIL) 200 MG tablet Take 400 mg by mouth daily as needed for headache.    . pantoprazole (PROTONIX) 40 MG tablet Take 1 tablet (40 mg total) by mouth 2 (two) times daily. 180 tablet 5   No current facility-administered medications for this visit.    Allergies as of 07/03/2019 - Review Complete 06/03/2019  Allergen Reaction Noted  . Z-pak [azithromycin] Hives 10/24/2018    Past Medical  History:  Diagnosis Date  . Abnormal vaginal Pap smear   . Anxiety   . Cystic acne   . Depression   . GERD (gastroesophageal reflux disease)     Past Surgical History:  Procedure Laterality Date  . carpel tunnel; release 02/03/19 Dr Amanda Pea  Bilateral   . CESAREAN SECTION    . CHOLECYSTECTOMY    . LAPAROSCOPIC GASTRIC SLEEVE RESECTION  2014  . TONSILLECTOMY      Family History  Problem Relation Age of Onset  . Congestive Heart Failure Father   . Hyperlipidemia Father   . Hypertension Father   . Heart disease Maternal Grandmother   . Hypertension Maternal Grandmother   . Hyperlipidemia Maternal Grandmother   . Diabetes Maternal Grandmother   . Cancer Maternal Grandfather        prostate    Social History   Socioeconomic History  . Marital status: Single    Spouse name: Not on file  . Number of children: 1  . Years of education: Not on file  . Highest education level: Not on file  Occupational History  . Not on file  Tobacco Use  . Smoking status: Former Games developer  . Smokeless tobacco: Never Used  Substance and Sexual Activity  . Alcohol use: Never    Comment: social   . Drug use: Never  . Sexual activity: Yes    Partners: Male  Other Topics Concern  . Not on file  Social  History Narrative  . Not on file   Social Determinants of Health   Financial Resource Strain:   . Difficulty of Paying Living Expenses: Not on file  Food Insecurity:   . Worried About Charity fundraiser in the Last Year: Not on file  . Ran Out of Food in the Last Year: Not on file  Transportation Needs:   . Lack of Transportation (Medical): Not on file  . Lack of Transportation (Non-Medical): Not on file  Physical Activity:   . Days of Exercise per Week: Not on file  . Minutes of Exercise per Session: Not on file  Stress:   . Feeling of Stress : Not on file  Social Connections:   . Frequency of Communication with Friends and Family: Not on file  . Frequency of Social Gatherings with  Friends and Family: Not on file  . Attends Religious Services: Not on file  . Active Member of Clubs or Organizations: Not on file  . Attends Archivist Meetings: Not on file  . Marital Status: Not on file  Intimate Partner Violence:   . Fear of Current or Ex-Partner: Not on file  . Emotionally Abused: Not on file  . Physically Abused: Not on file  . Sexually Abused: Not on file      ROS:  General: Negative for anorexia, weight loss, fever, chills, fatigue, weakness. Eyes: Negative for vision changes.  ENT: Negative for hoarseness, difficulty swallowing , nasal congestion. CV: Negative for chest pain, angina, palpitations, dyspnea on exertion, peripheral edema.  Respiratory: Negative for dyspnea at rest, dyspnea on exertion, cough, sputum, wheezing.  GI: See history of present illness. GU:  Negative for dysuria, hematuria, urinary incontinence, urinary frequency, nocturnal urination.  MS: Negative for joint pain, low back pain.  Derm: Negative for rash or itching.  Neuro: Negative for weakness, abnormal sensation, seizure, frequent headaches, memory loss, confusion.  Psych: Negative for anxiety, depression, suicidal ideation, hallucinations.  Endo: Negative for unusual weight change.  Heme: Negative for bruising or bleeding. Allergy: Negative for rash or hives.    Physical Examination:  There were no vitals taken for this visit.   General: Well-nourished, well-developed in no acute distress.  Head: Normocephalic, atraumatic.   Eyes: Conjunctiva pink, no icterus. Mouth: Oropharyngeal mucosa moist and pink , no lesions erythema or exudate. Neck: Supple without thyromegaly, masses, or lymphadenopathy.  Lungs: Clear to auscultation bilaterally.  Heart: Regular rate and rhythm, no murmurs rubs or gallops.  Abdomen: Bowel sounds are normal, nontender, nondistended, no hepatosplenomegaly or masses, no abdominal bruits or    hernia , no rebound or guarding.   Rectal:  *** Extremities: No lower extremity edema. No clubbing or deformities.  Neuro: Alert and oriented x 4 , grossly normal neurologically.  Skin: Warm and dry, no rash or jaundice.   Psych: Alert and cooperative, normal mood and affect.  Labs: ***  Imaging Studies: CT ABDOMEN PELVIS W CONTRAST  Addendum Date: 06/04/2019   ADDENDUM REPORT: 06/04/2019 09:06 ADDENDUM: Addendum created after patient was referred for evaluation of possible image guided drainage of the abscess. FINDINGS: The length of the colon is fluid-filled, and this includes the cecum. Coronal images best demonstrate the cecum on top of the right broad ligament/ovary, overlying the uterus. Normal ileocecal valve is identified on image 48 of series 6. A normal appendix is visualized on image sequence [id number] of series 6. There is no appendicolith. No inflammatory changes within the adjacent fat. No extraluminal air. IMPRESSION: The  CT is negative for acute appendicitis, within normal appendix visualized, and negative for pelvic abscess, with a fluid-filled right colon/cecum. The findings most likely represent a nonspecific enteritis/colitis. These results were discussed with Ms Carlena Bjornstad, PA, at the time of interpretation on 06/04/2019 at 9:05 am. Signed, Yvone Neu. Reyne Dumas, RPVI [facility] Radiology Electronically Signed   By: Gilmer Mor D.O.   On: 06/04/2019 09:06   Result Date: 06/04/2019 CLINICAL DATA:  Abdominal pain EXAM: CT ABDOMEN AND PELVIS WITH CONTRAST TECHNIQUE: Multidetector CT imaging of the abdomen and pelvis was performed using the standard protocol following bolus administration of intravenous contrast. CONTRAST:  OMNIPAQUE IOHEXOL 300 MG/ML  SOLN COMPARISON:  None. FINDINGS: Lower chest: The visualized heart size within normal limits. No pericardial fluid/thickening. No hiatal hernia. The visualized portions of the lungs are clear. Hepatobiliary: The liver is normal in  density without focal abnormality.The main portal vein is patent. No evidence of calcified gallstones, gallbladder wall thickening or biliary dilatation. Pancreas: Unremarkable. No pancreatic ductal dilatation or surrounding inflammatory changes. Spleen: Normal in size without focal abnormality. Adrenals/Urinary Tract: Both adrenal glands appear normal. The kidneys and collecting system appear normal without evidence of urinary tract calculus or hydronephrosis. Bladder is decompressed. Stomach/Bowel: The patient is status post gastric sleeve resection. A small hiatal hernia is present with food debris. The small bowel is unremarkable. Within the right lower quadrant there is a complex fluid collection with small foci of air seen within it measuring 9.6 x 4.2 cm extending from the distal cecal tip, which is likely due to perforated appendicitis. There is a small tubular air-filled structure seen within right lower quadrant, series 3, image 61 which could be a tiny focal portion of the appendix. Vascular/Lymphatic: There are no enlarged mesenteric, retroperitoneal, or pelvic lymph nodes. No significant vascular findings are present. Reproductive: IUD is seen within the endometrial canal. Within the left adnexa there is a 4.1 cm low-density lesion, likely ovarian cyst. Other: No evidence of abdominal wall mass or hernia. Musculoskeletal: No acute or significant osseous findings. IMPRESSION: Complex loculated fluid collection within the right lower quadrant measuring 9.6 x 4.2 cm extending from the the distal cecal tip which is likely periappendiceal or pericolonic abscess with perforation. Electronically Signed: By: Jonna Clark M.D. On: 06/03/2019 20:34

## 2019-07-11 ENCOUNTER — Encounter: Payer: Self-pay | Admitting: *Deleted

## 2019-07-14 ENCOUNTER — Emergency Department (HOSPITAL_BASED_OUTPATIENT_CLINIC_OR_DEPARTMENT_OTHER)
Admission: EM | Admit: 2019-07-14 | Discharge: 2019-07-15 | Disposition: A | Discharge status: Home / Self Care | Payer: 59 | Attending: Emergency Medicine | Admitting: Emergency Medicine

## 2019-07-14 ENCOUNTER — Encounter (HOSPITAL_BASED_OUTPATIENT_CLINIC_OR_DEPARTMENT_OTHER): Payer: Self-pay | Admitting: *Deleted

## 2019-07-14 ENCOUNTER — Other Ambulatory Visit: Payer: Self-pay

## 2019-07-14 DIAGNOSIS — Z975 Presence of (intrauterine) contraceptive device: Secondary | ICD-10-CM | POA: Diagnosis not present

## 2019-07-14 DIAGNOSIS — Z87891 Personal history of nicotine dependence: Secondary | ICD-10-CM | POA: Insufficient documentation

## 2019-07-14 DIAGNOSIS — Z79899 Other long term (current) drug therapy: Secondary | ICD-10-CM | POA: Insufficient documentation

## 2019-07-14 DIAGNOSIS — K529 Noninfective gastroenteritis and colitis, unspecified: Secondary | ICD-10-CM | POA: Diagnosis not present

## 2019-07-14 DIAGNOSIS — K760 Fatty (change of) liver, not elsewhere classified: Secondary | ICD-10-CM | POA: Diagnosis not present

## 2019-07-14 DIAGNOSIS — R112 Nausea with vomiting, unspecified: Secondary | ICD-10-CM

## 2019-07-14 LAB — URINALYSIS, ROUTINE W REFLEX MICROSCOPIC
Bilirubin Urine: NEGATIVE
Glucose, UA: NEGATIVE mg/dL
Hgb urine dipstick: NEGATIVE
Ketones, ur: NEGATIVE mg/dL
Leukocytes,Ua: NEGATIVE
Nitrite: NEGATIVE
Protein, ur: NEGATIVE mg/dL
Specific Gravity, Urine: >1.03 — ABNORMAL HIGH (ref 1.005–1.030)
pH: 5.5 (ref 5.0–8.0)

## 2019-07-14 LAB — COMPREHENSIVE METABOLIC PANEL
ALT: 33 U/L (ref 0–44)
AST: 30 U/L (ref 15–41)
Albumin: 4.5 g/dL (ref 3.5–5.0)
Alkaline Phosphatase: 103 U/L (ref 38–126)
Anion gap: 10 (ref 5–15)
BUN: 11 mg/dL (ref 6–20)
CO2: 23 mmol/L (ref 22–32)
Calcium: 9.4 mg/dL (ref 8.9–10.3)
Chloride: 105 mmol/L (ref 98–111)
Creatinine, Ser: 0.71 mg/dL (ref 0.44–1.00)
GFR calc Af Amer: >60 mL/min (ref >60)
GFR calc non Af Amer: >60 mL/min (ref >60)
Glucose, Bld: 118 mg/dL — ABNORMAL HIGH (ref 70–99)
Potassium: 4.4 mmol/L (ref 3.5–5.1)
Sodium: 138 mmol/L (ref 135–145)
Total Bilirubin: 0.5 mg/dL (ref 0.3–1.2)
Total Protein: 7.8 g/dL (ref 6.5–8.1)

## 2019-07-14 LAB — CBC
HCT: 44.7 % (ref 36.0–46.0)
Hemoglobin: 14.8 g/dL (ref 12.0–15.0)
MCH: 31 pg (ref 26.0–34.0)
MCHC: 33.1 g/dL (ref 30.0–36.0)
MCV: 93.5 fL (ref 80.0–100.0)
Platelets: 299 10*3/uL (ref 150–400)
RBC: 4.78 MIL/uL (ref 3.87–5.11)
RDW: 11.9 % (ref 11.5–15.5)
WBC: 25.3 10*3/uL — ABNORMAL HIGH (ref 4.0–10.5)
nRBC: 0 % (ref 0.0–0.2)

## 2019-07-14 LAB — LIPASE, BLOOD: Lipase: 27 U/L (ref 11–51)

## 2019-07-14 MED ORDER — ONDANSETRON HCL 4 MG/2ML IJ SOLN
4.0000 mg | Freq: Once | INTRAMUSCULAR | Status: AC
  Administered 2019-07-15: 4 mg via INTRAVENOUS
  Filled 2019-07-14: qty 2

## 2019-07-14 MED ORDER — SODIUM CHLORIDE 0.9 % IV BOLUS (SEPSIS)
1000.0000 mL | Freq: Once | INTRAVENOUS | Status: AC
  Administered 2019-07-15: 1000 mL via INTRAVENOUS

## 2019-07-14 MED ORDER — SODIUM CHLORIDE 0.9 % IV SOLN
1000.0000 mL | INTRAVENOUS | Status: DC
  Administered 2019-07-15: 1000 mL via INTRAVENOUS

## 2019-07-14 MED ORDER — SODIUM CHLORIDE 0.9% FLUSH
3.0000 mL | Freq: Once | INTRAVENOUS | Status: DC
  Filled 2019-07-14: qty 3

## 2019-07-14 MED ORDER — ONDANSETRON 4 MG PO TBDP
4.0000 mg | ORAL_TABLET | Freq: Once | ORAL | Status: AC | PRN
  Administered 2019-07-14: 4 mg via ORAL
  Filled 2019-07-14: qty 1

## 2019-07-14 NOTE — ED Triage Notes (Signed)
Abdominal pain. She has a hx of Colitis. Vomiting and diarrhea today.

## 2019-07-14 NOTE — ED Provider Notes (Signed)
[facility] EMERGENCY DEPARTMENT Provider Note  CSN: [id number] Arrival date & time: 07/14/19 2024  Chief Complaint(s) Abdominal Pain  HPI Anna Blake is a 34 y.o. female who presents to the emergency department with right-sided abdominal cramping with associated nausea and nonbloody nonbilious emesis and diarrhea.  Pain began around 4 PM this afternoon.  No alleviating or aggravating factors.  This is similar to recent colitis/appendicitis for which she was admitted and treated with antibiotics.  She denies any fevers or chills.  No chest pain or shortness of breath.  No urinary symptoms.  No other physical complaints.  HPI  Past Medical History Past Medical History:  Diagnosis Date  . Abnormal vaginal Pap smear   . Anxiety   . Cystic acne   . Depression   . GERD (gastroesophageal reflux disease)    Patient Active Problem List   Diagnosis Date Noted  . Nausea with vomiting 06/04/2019  . Diarrhea 06/04/2019  . Irritable bowel syndrome with predominant constipation 06/04/2019  . Gastroenteritis 06/03/2019  . Mixed hyperlipidemia 05/25/2019  . GAD (generalized anxiety disorder) 03/15/2019  . Depression, recurrent (HCC) 03/15/2019  . Cystic acne vulgaris 03/15/2019  . BMI 36.0-36.9,adult 03/15/2019  . IUD (intrauterine device) in place 10/26/2018  . Former tobacco use 10/26/2018  . History of cholecystectomy 10/26/2018  . History of tonsillectomy 10/26/2018  . History of bariatric surgery 10/26/2018  . History of abnormal cervical Pap smear 10/26/2018  . Anxiety and depression 10/26/2018  . Gastroesophageal reflux disease without esophagitis 10/26/2018  . Abnormal weight gain 10/26/2018   Home Medication(s) Prior to Admission medications   Medication Sig Start Date End Date Taking? Authorizing Provider  acetaminophen (TYLENOL) 325 MG tablet Take 2 tablets (650 mg total) by mouth every 6 (six) hours as needed for mild pain (or temp > 100). 06/05/19  Yes Rayburn,  Alphonsus Sias, PA-C  Biotin 1000 MCG tablet Take 1,000 mcg by mouth daily.    Yes [provider]  buPROPion (WELLBUTRIN XL) 150 MG 24 hr tablet TAKE 2 TABLETS BY MOUTH IN THE MORNING Patient taking differently: Take 300 mg by mouth daily.  03/15/19  Yes Rakes, Doralee Albino, FNP  FLUoxetine (PROZAC) 40 MG capsule Take 1 capsule (40 mg total) by mouth daily. 03/15/19 07/26/19 Yes Rakes, Doralee Albino, FNP  ibuprofen (ADVIL) 200 MG tablet Take 400 mg by mouth daily as needed for headache.   Yes [provider]  pantoprazole (PROTONIX) 40 MG tablet Take 1 tablet (40 mg total) by mouth 2 (two) times daily. 03/15/19 07/26/19 Yes Rakes, Doralee Albino, FNP  ciprofloxacin (CIPRO) 500 MG tablet Take 1 tablet (500 mg total) by mouth 2 (two) times daily for 10 days. 07/15/19 07/25/19  Nira Conn, MD  fluconazole (DIFLUCAN) 150 MG tablet Take 1 tablet (150 mg total) by mouth daily for 2 days. 07/15/19 07/17/19  Nira Conn, MD  metroNIDAZOLE (FLAGYL) 500 MG tablet Take 1 tablet (500 mg total) by mouth 3 (three) times daily for 10 days. 07/15/19 07/25/19  Nira Conn, MD  ondansetron (ZOFRAN ODT) 4 MG disintegrating tablet Take 1 tablet (4 mg total) by mouth every 8 (eight) hours as needed for up to 3 days for nausea or vomiting. 07/15/19 07/18/19  Jyron Turman, Amadeo Garnet, MD  Past Surgical History Past Surgical History:  Procedure Laterality Date  . carpel tunnel; release 02/03/19 Dr Amanda PeaGramig  Bilateral   . CESAREAN SECTION    . CHOLECYSTECTOMY    . LAPAROSCOPIC GASTRIC SLEEVE RESECTION  2014  . TONSILLECTOMY     Family History Family History  Problem Relation Age of Onset  . Congestive Heart Failure Father   . Hyperlipidemia Father   . Hypertension Father   . Heart disease Maternal Grandmother   . Hypertension Maternal Grandmother   . Hyperlipidemia  Maternal Grandmother   . Diabetes Maternal Grandmother   . Cancer Maternal Grandfather        prostate    Social History Social History   Tobacco Use  . Smoking status: Former Games developermoker  . Smokeless tobacco: Never Used  Substance Use Topics  . Alcohol use: Never    Comment: social   . Drug use: Never   Allergies Z-pak [azithromycin]  Review of Systems Review of Systems All other systems are reviewed and are negative for acute change except as noted in the HPI  Physical Exam Vital Signs  I have reviewed the triage vital signs BP 116/73 (BP Location: Right Arm)   Pulse 88   Resp 18   Ht 5\' 2"  (1.575 m)   Wt 90.7 kg   SpO2 100%   BMI 36.58 kg/m   Physical Exam Vitals reviewed.  Constitutional:      General: She is not in acute distress.    Appearance: She is well-developed. She is not diaphoretic.  HENT:     Head: Normocephalic and atraumatic.     Right Ear: External ear normal.     Left Ear: External ear normal.     Nose: Nose normal.  Eyes:     General: No scleral icterus.    Conjunctiva/sclera: Conjunctivae normal.  Neck:     Trachea: Phonation normal.  Cardiovascular:     Rate and Rhythm: Normal rate and regular rhythm.  Pulmonary:     Effort: Pulmonary effort is normal. No respiratory distress.     Breath sounds: No stridor.  Abdominal:     General: There is no distension.     Tenderness: There is abdominal tenderness in the right lower quadrant. There is no guarding or rebound.  Musculoskeletal:        General: Normal range of motion.     Cervical back: Normal range of motion.  Neurological:     Mental Status: She is alert and oriented to person, place, and time.  Psychiatric:        Behavior: Behavior normal.     ED Results and Treatments Labs (all labs ordered are listed, but only abnormal results are displayed) Labs Reviewed  COMPREHENSIVE METABOLIC PANEL - Abnormal; Notable for the following components:      Result Value   Glucose, Bld  118 (*)    All other components within normal limits  CBC - Abnormal; Notable for the following components:   WBC 25.3 (*)    All other components within normal limits  URINALYSIS, ROUTINE W REFLEX MICROSCOPIC - Abnormal; Notable for the following components:   Specific Gravity, Urine >1.030 (*)    All other components within normal limits  LIPASE, BLOOD  LACTIC ACID, PLASMA  PREGNANCY, URINE  EKG  EKG Interpretation  Date/Time:    Ventricular Rate:    PR Interval:    QRS Duration:   QT Interval:    QTC Calculation:   R Axis:     Text Interpretation:        Radiology CT ABDOMEN PELVIS W CONTRAST  Addendum Date: 07/15/2019   ADDENDUM REPORT: 07/15/2019 01:31 ADDENDUM: Addendum to the initial report was discussed via telephone 07/15/2019 at 1:31 am to provider [facility] , who verbally acknowledged these results. Electronically Signed   By: Kreg Shropshire M.D.   On: 07/15/2019 01:31   Addendum Date: 07/15/2019   ADDENDUM REPORT: 07/15/2019 01:20 ADDENDUM: Additional impression point: 3. Mild venous engorgement of the left adnexa, such findings could be seen in the setting of pelvic congestion syndrome. Recommend correlation with clinical symptoms and features. Electronically Signed   By: Kreg Shropshire M.D.   On: 07/15/2019 01:20   Result Date: 07/15/2019 CLINICAL DATA:  Lower quadrant abdominal pain, history of colitis a EXAM: CT ABDOMEN AND PELVIS WITH CONTRAST TECHNIQUE: Multidetector CT imaging of the abdomen and pelvis was performed using the standard protocol following bolus administration of intravenous contrast. CONTRAST:  OMNIPAQUE IOHEXOL 300 MG/ML  SOLN COMPARISON:  CT 06/03/2019 FINDINGS: Lower chest: Lung bases are clear. Normal heart size. No pericardial effusion. Hepatobiliary: Diffuse hepatic hypoattenuation compatible with hepatic steatosis. No  focal liver abnormality is seen. Patient is post cholecystectomy. Slight prominence of the biliary tree likely related to reservoir effect. No calcified intraductal gallstones. Pancreas: Unremarkable. No pancreatic ductal dilatation or surrounding inflammatory changes. Spleen: Normal in size without focal abnormality. Adrenals/Urinary Tract: Adrenal glands are unremarkable. Kidneys are normal, without renal calculi, focal lesion, or hydronephrosis. Bladder is unremarkable. Stomach/Bowel: Small hiatal hernia. Postsurgical changes from prior sleeve gastrectomy. Stomach is otherwise unremarkable. Duodenal sweep takes a normal course across the midline abdomen. No small bowel dilatation or wall thickening. Abundant fat at the ileocecal valve is similar to prior. There is a patulous, fluid-filled appearance of the cecum normal appearing appendix curls about the cecal tip. There is mild diffuse mural thickening of the colon with faint hazy pericolonic changes and slight mural hyperemia towards the descending and sigmoid colon. There is a notable lack of formed stool within the colon. Vascular/Lymphatic: The aorta is normal caliber. No suspicious or enlarged lymph nodes in the included lymphatic chains. Reproductive: Anteverted uterus with normally positioned IUD. Mild venous congestion of the left adnexa. No concerning adnexal lesions in this reproductive age female. Few normal follicles in both ovaries. Other: No abdominopelvic free fluid or air. No bowel containing hernias. Fat containing right inguinal hernia is seen. Musculoskeletal: Mild multilevel degenerative changes are present in the imaged portions of the spine. No acute osseous abnormality or suspicious osseous lesion. Mild levocurvature of the lumbar spine, likely positional. IMPRESSION: 1. Mild diffuse mural thickening of the colon with faint hazy pericolonic changes and slight mural hyperemia towards the descending and sigmoid colon. Findings are suggestive  of infectious or inflammatory colitis. Fluid-filled appearance of the colon suggests rapid transit state or diarrheal illness. 2. Hepatic steatosis. Electronically Signed: By: Kreg Shropshire M.D. On: 07/15/2019 00:58    Pertinent labs & imaging results that were available during my care of the patient were reviewed by me and considered in my medical decision making (see chart for details).  Medications Ordered in ED Medications  sodium chloride flush (NS) 0.9 % injection 3 mL ( Intravenous Canceled Entry 07/14/19 2251)  sodium chloride 0.9 %  bolus 1,000 mL (0 mLs Intravenous Stopped 07/15/19 0157)    Followed by  0.9 %  sodium chloride infusion ( Intravenous Stopped 07/15/19 0243)  ondansetron (ZOFRAN-ODT) disintegrating tablet 4 mg (4 mg Oral Given 07/14/19 2041)  ondansetron (ZOFRAN) injection 4 mg (4 mg Intravenous Given 07/15/19 0027)  iohexol (OMNIPAQUE) 300 MG/ML solution 100 mL (100 mLs Intravenous Contrast Given 07/15/19 0038)  ciprofloxacin (CIPRO) tablet 500 mg (500 mg Oral Given 07/15/19 0242)  metroNIDAZOLE (FLAGYL) tablet 500 mg (500 mg Oral Given 07/15/19 0242)                                                                                                                                    Procedures Procedures  (including critical care time)  Medical Decision Making / ED Course I have reviewed the nursing notes for this encounter and the patient's prior records (if available in EHR or on provided paperwork).   Makiah Foye was evaluated in Emergency Department on 07/15/2019 for the symptoms described in the history of present illness. She was evaluated in the context of the global COVID-19 pandemic, which necessitated consideration that the patient might be at risk for infection with the SARS-CoV-2 virus that causes COVID-19. Institutional protocols and algorithms that pertain to the evaluation of patients at risk for COVID-19 are in a state of rapid change based on information released  by regulatory bodies including the CDC and federal and state organizations. These policies and algorithms were followed during the patient's care in the ED.  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  No abscess formation and no perforation.  Patient is not septic at this time.  Treated symptomatically with IV fluids and antiemetics.  Patient monitored for several hours.  Able to tolerate oral intake.  Provided with first doses of antibiotics in the emergency department.  Patient already has a scheduled appointment with gastroenterology this upcoming week.  Unable to provide stool sample.  Recommended she discuss stool sampling with primary care provider or GI.        Final Clinical Impression(s) / ED Diagnoses Final diagnoses:  Colitis  Nausea vomiting and diarrhea    The patient appears reasonably screened and/or stabilized for discharge and I doubt any other medical condition or other [facility] requiring further screening, evaluation, or treatment in the ED at this time prior to discharge. Safe for discharge with strict return precautions.  Disposition: Discharge  Condition: Good  I have discussed the results, Dx and Tx plan with the patient/family who expressed understanding and agree(s) with the plan. Discharge instructions discussed at length. The patient/family was given strict return precautions who verbalized understanding of the instructions. No further questions at time of discharge.    ED Discharge Orders         Ordered    ciprofloxacin (CIPRO) 500 MG tablet  2 times daily     07/15/19 0319    metroNIDAZOLE (FLAGYL) 500 MG tablet  3 times daily     07/15/19 0319    fluconazole (DIFLUCAN) 150 MG tablet  Daily     07/15/19 0319    ondansetron (ZOFRAN ODT) 4 MG disintegrating tablet  Every 8 hours PRN     07/15/19 0319            Follow Up: Baruch Gouty, [place]  [id number] [phone]  Schedule an appointment as soon as possible for a  visit  in 3-5 days, If symptoms do not improve or  worsen  Gastroenterology   as scheduled     This chart was dictated using voice recognition software.  Despite best efforts to proofread,  errors can occur which can change the documentation meaning.   Fatima Blank, MD 07/15/19 [phone]

## 2019-07-15 ENCOUNTER — Encounter (HOSPITAL_BASED_OUTPATIENT_CLINIC_OR_DEPARTMENT_OTHER): Payer: Self-pay

## 2019-07-15 ENCOUNTER — Emergency Department (HOSPITAL_BASED_OUTPATIENT_CLINIC_OR_DEPARTMENT_OTHER): Payer: 59

## 2019-07-15 DIAGNOSIS — K529 Noninfective gastroenteritis and colitis, unspecified: Secondary | ICD-10-CM | POA: Diagnosis not present

## 2019-07-15 DIAGNOSIS — K760 Fatty (change of) liver, not elsewhere classified: Secondary | ICD-10-CM | POA: Diagnosis not present

## 2019-07-15 LAB — PREGNANCY, URINE: Preg Test, Ur: NEGATIVE

## 2019-07-15 LAB — LACTIC ACID, PLASMA: Lactic Acid, Venous: 1.6 mmol/L (ref 0.5–1.9)

## 2019-07-15 MED ORDER — METRONIDAZOLE 500 MG PO TABS
500.0000 mg | ORAL_TABLET | Freq: Once | ORAL | Status: AC
  Administered 2019-07-15: 500 mg via ORAL
  Filled 2019-07-15: qty 1

## 2019-07-15 MED ORDER — METRONIDAZOLE 500 MG PO TABS: 500.0000 mg | ORAL_TABLET | Freq: Three times a day (TID) | ORAL | 0 refills | Status: AC

## 2019-07-15 MED ORDER — CIPROFLOXACIN HCL 500 MG PO TABS: 500.0000 mg | ORAL_TABLET | Freq: Two times a day (BID) | ORAL | 0 refills | Status: AC

## 2019-07-15 MED ORDER — ONDANSETRON 4 MG PO TBDP: 4.0000 mg | ORAL_TABLET | Freq: Three times a day (TID) | ORAL | 0 refills | Status: AC | PRN

## 2019-07-15 MED ORDER — IOHEXOL 300 MG/ML  SOLN
100.0000 mL | Freq: Once | INTRAMUSCULAR | Status: AC | PRN
  Administered 2019-07-15: 01:00:00 100 mL via INTRAVENOUS

## 2019-07-15 MED ORDER — CIPROFLOXACIN HCL 500 MG PO TABS
500.0000 mg | ORAL_TABLET | Freq: Once | ORAL | Status: AC
  Administered 2019-07-15: 500 mg via ORAL
  Filled 2019-07-15: qty 1

## 2019-07-15 MED ORDER — FLUCONAZOLE 150 MG PO TABS: 150.0000 mg | ORAL_TABLET | Freq: Every day | ORAL | 0 refills | Status: AC

## 2019-07-18 ENCOUNTER — Encounter: Payer: Self-pay | Admitting: *Deleted

## 2019-07-18 ENCOUNTER — Telehealth: Payer: Self-pay | Admitting: *Deleted

## 2019-07-18 ENCOUNTER — Telehealth: Payer: Self-pay | Admitting: Internal Medicine

## 2019-07-18 ENCOUNTER — Ambulatory Visit (INDEPENDENT_AMBULATORY_CARE_PROVIDER_SITE_OTHER): Payer: 59 | Admitting: Gastroenterology

## 2019-07-18 ENCOUNTER — Encounter: Payer: Self-pay | Admitting: Gastroenterology

## 2019-07-18 ENCOUNTER — Other Ambulatory Visit: Payer: Self-pay

## 2019-07-18 VITALS — BP 121/83 | HR 93 | Temp 97.1°F | Ht 62.0 in | Wt 206.2 lb

## 2019-07-18 DIAGNOSIS — R197 Diarrhea, unspecified: Secondary | ICD-10-CM

## 2019-07-18 DIAGNOSIS — D72829 Elevated white blood cell count, unspecified: Secondary | ICD-10-CM | POA: Insufficient documentation

## 2019-07-18 DIAGNOSIS — R933 Abnormal findings on diagnostic imaging of other parts of digestive tract: Secondary | ICD-10-CM | POA: Diagnosis not present

## 2019-07-18 MED ORDER — CLENPIQ 10-3.5-12 MG-GM -GM/160ML PO SOLN: 1.0000 | Freq: Once | ORAL | 0 refills | Status: DC

## 2019-07-18 MED ORDER — CLENPIQ 10-3.5-12 MG-GM -GM/160ML PO SOLN: 1.0000 | Freq: Once | ORAL | 0 refills | Status: AC

## 2019-07-18 NOTE — Telephone Encounter (Signed)
Checked [employer] for PA but coverage is terminating 07/26/2019.

## 2019-07-18 NOTE — Telephone Encounter (Signed)
Called pt. She is needing to change procedure date back to 6/10. This has been changed back to 6/10 at 1:45pm. Aware will mail new pre-op/covid test appt with instructions. Called endo and LMOVM making aware of change.

## 2019-07-18 NOTE — Telephone Encounter (Signed)
Called pt and made aware pre-op/covid testing appt is scheduled for 3/30 at 3:00pm for covid test and 3:15pm for pre-op appt. Aware of location/date/time. She is also aware prep instructions are on mychart and has been mailed to her.

## 2019-07-18 NOTE — Patient Instructions (Signed)
1. Please complete stool test and blood work as soon as you can.  2. Complete cipro/flagyl.  3. Call if you have persistent or recurrent symptoms.  4. Colonoscopy as scheduled. See separate instructions.

## 2019-07-18 NOTE — Telephone Encounter (Signed)
Patient called in requesting to schedule for date 4/1 at 1:30pm. Orders changed. Rx sent to pharmacy. Instructions mailed.

## 2019-07-18 NOTE — Progress Notes (Signed)
Primary Care Physician:  Sonny Masters, FNP  Primary Gastroenterologist:  Roetta Sessions, MD   Chief Complaint  Patient presents with  . colitis    taking Flagyl and Cipro; went to ED 07/14/19; some diarrhea now  . Gastroesophageal Reflux    HPI:  Anna Blake is a 34 y.o. female here at the request of Gilford Silvius, FNP for further evaluation of gastroenteritis, colitis, GERD.  Patient states she has had 2 episodes of acute onset crampy abdominal pain associated with vomiting and diarrhea.  First episode was back in February.  She had right lower quadrant pain at that time and there was concern for possible appendicitis.  CT scan on February 7 initially read as complex loculated fluid collection within the right lower quadrant measuring 9.6 x 4.2 cm extending from the distal cecal tip likely representing periappendiceal or pericolonic abscess with perforation however addendum was noted after patient was referred for IR guided drainage of abscess.  Addendum noted that the appendix appeared normal, fluid-filled right colon/cecum suggesting nonspecific colitis.  Status post gastric sleeve resection.  Patient states she was treated with Augmentin at that time.  Symptoms resolved.  Last Friday she developed recurrent symptoms of crampy abdominal pain associated with vomiting and diarrhea.CT abdomen pelvis with contrast on March 2021 showed hepatic steatosis, small hiatal hernia, mild diffuse mural thickening of the colon with faint hazy pericolonic changes and slight mural hyperemia towards the descending and sigmoid colon.  Findings suggestive of infectious or inflammatory colitis.  Currently on Cipro and Flagyl, 10-day course planned.  Continues to have nonbloody diarrhea although down to having 1-2 loose stools daily for the last couple of days.  She denies prior stomach issues except for heartburn which is developed since her gastric sleeve surgery.  Chronically she has been constipated requiring 2  senna and 2 Colace daily up until February when the symptoms started.  Patient denies any ill contacts.  She does not drink well water.  No livestock exposure.  She is concerned that her white blood cell count was in the 25,000 range when she was ill.  Rarely takes ibuprofen.  Sigmoidoscopy while in teens, for the alternating/constipation.  Does not recall the findings.  States she has been told that she has IBS.  Father had colon cancer at age 52 involving the cecum.     Current Outpatient Medications  Medication Sig Dispense Refill  . acetaminophen (TYLENOL) 325 MG tablet Take 2 tablets (650 mg total) by mouth every 6 (six) hours as needed for mild pain (or temp > 100).    . Biotin 1000 MCG tablet Take 1,000 mcg by mouth daily.     Marland Kitchen buPROPion (WELLBUTRIN XL) 150 MG 24 hr tablet TAKE 2 TABLETS BY MOUTH IN THE MORNING (Patient taking differently: Take 300 mg by mouth daily. ) 60 tablet 5  . ciprofloxacin (CIPRO) 500 MG tablet Take 1 tablet (500 mg total) by mouth 2 (two) times daily for 10 days. 20 tablet 0  . FLUoxetine (PROZAC) 40 MG capsule Take 1 capsule (40 mg total) by mouth daily. 30 capsule 5  . ibuprofen (ADVIL) 200 MG tablet Take 400 mg by mouth daily as needed for headache.    . metroNIDAZOLE (FLAGYL) 500 MG tablet Take 1 tablet (500 mg total) by mouth 3 (three) times daily for 10 days. 30 tablet 0  . ondansetron (ZOFRAN ODT) 4 MG disintegrating tablet Take 1 tablet (4 mg total) by mouth every 8 (eight) hours as needed for  up to 3 days for nausea or vomiting. 15 tablet 0  . pantoprazole (PROTONIX) 40 MG tablet Take 1 tablet (40 mg total) by mouth 2 (two) times daily. 180 tablet 5   No current facility-administered medications for this visit.    Allergies as of 07/18/2019 - Review Complete 07/18/2019  Allergen Reaction Noted  . Z-pak [azithromycin] Hives 10/24/2018    Past Medical History:  Diagnosis Date  . Abnormal vaginal Pap smear   . Anxiety   . Cystic acne   .  Depression   . GERD (gastroesophageal reflux disease)     Past Surgical History:  Procedure Laterality Date  . carpel tunnel; release 02/03/19 Dr Amanda Pea  Bilateral   . CESAREAN SECTION    . CHOLECYSTECTOMY    . EXPLORATORY LAPAROTOMY     endometriosis  . LAPAROSCOPIC GASTRIC SLEEVE RESECTION  2014  . TONSILLECTOMY      Family History  Problem Relation Age of Onset  . Congestive Heart Failure Father   . Hyperlipidemia Father   . Hypertension Father   . Colon cancer Father        31  . Heart disease Maternal Grandmother   . Hypertension Maternal Grandmother   . Hyperlipidemia Maternal Grandmother   . Diabetes Maternal Grandmother   . Cancer Maternal Grandfather        prostate  . Colon cancer Other        great grandfather    Social History   Socioeconomic History  . Marital status: Single    Spouse name: Not on file  . Number of children: 1  . Years of education: Not on file  . Highest education level: Not on file  Occupational History  . Not on file  Tobacco Use  . Smoking status: Former Games developer  . Smokeless tobacco: Never Used  Substance and Sexual Activity  . Alcohol use: Not Currently    Comment: social   . Drug use: Never  . Sexual activity: Yes    Partners: Male  Other Topics Concern  . Not on file  Social History Narrative  . Not on file   Social Determinants of Health   Financial Resource Strain:   . Difficulty of Paying Living Expenses:   Food Insecurity:   . Worried About Programme researcher, broadcasting/film/video in the Last Year:   . Barista in the Last Year:   Transportation Needs:   . Freight forwarder (Medical):   Marland Kitchen Lack of Transportation (Non-Medical):   Physical Activity:   . Days of Exercise per Week:   . Minutes of Exercise per Session:   Stress:   . Feeling of Stress :   Social Connections:   . Frequency of Communication with Friends and Family:   . Frequency of Social Gatherings with Friends and Family:   . Attends Religious Services:    . Active Member of Clubs or Organizations:   . Attends Banker Meetings:   Marland Kitchen Marital Status:   Intimate Partner Violence:   . Fear of Current or Ex-Partner:   . Emotionally Abused:   Marland Kitchen Physically Abused:   . Sexually Abused:       ROS:  General: Negative for anorexia, weight loss, fever, chills, fatigue, weakness. Eyes: Negative for vision changes.  ENT: Negative for hoarseness, difficulty swallowing , nasal congestion. CV: Negative for chest pain, angina, palpitations, dyspnea on exertion, peripheral edema.  Respiratory: Negative for dyspnea at rest, dyspnea on exertion, cough, sputum, wheezing.  GI: See history of present illness. GU:  Negative for dysuria, hematuria, urinary incontinence, urinary frequency, nocturnal urination.  MS: Negative for joint pain, low back pain.  Derm: Negative for rash or itching.  Neuro: Negative for weakness, abnormal sensation, seizure, frequent headaches, memory loss, confusion.  Psych: Negative for anxiety, depression, suicidal ideation, hallucinations.  Endo: Negative for unusual weight change.  Heme: Negative for bruising or bleeding. Allergy: Negative for rash or hives.    Physical Examination:  BP 121/83   Pulse 93   Temp (!) 97.1 F (36.2 C) (Temporal)   Ht 5\' 2"  (1.575 m)   Wt 206 lb 3.2 oz (93.5 kg)   BMI 37.71 kg/m    General: Well-nourished, well-developed in no acute distress.  Head: Normocephalic, atraumatic.   Eyes: Conjunctiva pink, no icterus. Mouth: Oropharyngeal mucosa moist and pink , no lesions erythema or exudate. Neck: Supple without thyromegaly, masses, or lymphadenopathy.  Lungs: Clear to auscultation bilaterally.  Heart: Regular rate and rhythm, no murmurs rubs or gallops.  Abdomen: Bowel sounds are normal, mild lower abd tenderness, nondistended, no hepatosplenomegaly or masses, no abdominal bruits or    hernia , no rebound or guarding.   Rectal: not performed Extremities: No lower extremity  edema. No clubbing or deformities.  Neuro: Alert and oriented x 4 , grossly normal neurologically.  Skin: Warm and dry, no rash or jaundice.   Psych: Alert and cooperative, normal mood and affect.  Labs: Lab Results  Component Value Date   CREATININE 0.71 07/14/2019   BUN 11 07/14/2019   NA 138 07/14/2019   K 4.4 07/14/2019   CL 105 07/14/2019   CO2 23 07/14/2019   Lab Results  Component Value Date   ALT 33 07/14/2019   AST 30 07/14/2019   ALKPHOS 103 07/14/2019   BILITOT 0.5 07/14/2019   Lab Results  Component Value Date   WBC 25.3 (H) 07/14/2019   HGB 14.8 07/14/2019   HCT 44.7 07/14/2019   MCV 93.5 07/14/2019   PLT 299 07/14/2019   Lab Results  Component Value Date   LIPASE 27 07/14/2019     Imaging Studies: CT ABDOMEN PELVIS W CONTRAST  Addendum Date: 07/15/2019   ADDENDUM REPORT: 07/15/2019 01:31 ADDENDUM: Addendum to the initial report was discussed via telephone 07/15/2019 at 1:31 am to provider [facility] , who verbally acknowledged these results. Electronically Signed   By: [facility] M.D.   On: 07/15/2019 01:31   Addendum Date: 07/15/2019   ADDENDUM REPORT: 07/15/2019 01:20 ADDENDUM: Additional impression point: 3. Mild venous engorgement of the left adnexa, such findings could be seen in the setting of pelvic congestion syndrome. Recommend correlation with clinical symptoms and features. Electronically Signed   By: 07/17/2019 M.D.   On: 07/15/2019 01:20   Result Date: 07/15/2019 CLINICAL DATA:  Lower quadrant abdominal pain, history of colitis a EXAM: CT ABDOMEN AND PELVIS WITH CONTRAST TECHNIQUE: Multidetector CT imaging of the abdomen and pelvis was performed using the standard protocol following bolus administration of intravenous contrast. CONTRAST:  07/17/2019 OMNIPAQUE IOHEXOL 300 MG/ML  SOLN COMPARISON:  CT 06/03/2019 FINDINGS: Lower chest: Lung bases are clear. Normal heart size. No pericardial effusion. Hepatobiliary: Diffuse hepatic hypoattenuation  compatible with hepatic steatosis. No focal liver abnormality is seen. Patient is post cholecystectomy. Slight prominence of the biliary tree likely related to reservoir effect. No calcified intraductal gallstones. Pancreas: Unremarkable. No pancreatic ductal dilatation or surrounding inflammatory changes. Spleen: Normal in size without focal abnormality. Adrenals/Urinary Tract: Adrenal  glands are unremarkable. Kidneys are normal, without renal calculi, focal lesion, or hydronephrosis. Bladder is unremarkable. Stomach/Bowel: Small hiatal hernia. Postsurgical changes from prior sleeve gastrectomy. Stomach is otherwise unremarkable. Duodenal sweep takes a normal course across the midline abdomen. No small bowel dilatation or wall thickening. Abundant fat at the ileocecal valve is similar to prior. There is a patulous, fluid-filled appearance of the cecum normal appearing appendix curls about the cecal tip. There is mild diffuse mural thickening of the colon with faint hazy pericolonic changes and slight mural hyperemia towards the descending and sigmoid colon. There is a notable lack of formed stool within the colon. Vascular/Lymphatic: The aorta is normal caliber. No suspicious or enlarged lymph nodes in the included lymphatic chains. Reproductive: Anteverted uterus with normally positioned IUD. Mild venous congestion of the left adnexa. No concerning adnexal lesions in this reproductive age female. Few normal follicles in both ovaries. Other: No abdominopelvic free fluid or air. No bowel containing hernias. Fat containing right inguinal hernia is seen. Musculoskeletal: Mild multilevel degenerative changes are present in the imaged portions of the spine. No acute osseous abnormality or suspicious osseous lesion. Mild levocurvature of the lumbar spine, likely positional. IMPRESSION: 1. Mild diffuse mural thickening of the colon with faint hazy pericolonic changes and slight mural hyperemia towards the descending and  sigmoid colon. Findings are suggestive of infectious or inflammatory colitis. Fluid-filled appearance of the colon suggests rapid transit state or diarrheal illness. 2. Hepatic steatosis. Electronically Signed: By: Lovena Le M.D. On: 07/15/2019 00:58

## 2019-07-18 NOTE — Telephone Encounter (Signed)
Patient called and said she needs to move her procedure back to the June appointment because of her job

## 2019-07-19 LAB — CBC WITH DIFFERENTIAL/PLATELET
Absolute Monocytes: 797 cells/uL (ref 200–950)
Basophils Absolute: 60 cells/uL (ref 0–200)
Basophils Relative: 0.5 %
Eosinophils Absolute: 417 cells/uL (ref 15–500)
Eosinophils Relative: 3.5 %
HCT: 39.2 % (ref 35.0–45.0)
Hemoglobin: 13.1 g/dL (ref 11.7–15.5)
Lymphs Abs: 2213 cells/uL (ref 850–3900)
MCH: 30.4 pg (ref 27.0–33.0)
MCHC: 33.4 g/dL (ref 32.0–36.0)
MCV: 91 fL (ref 80.0–100.0)
MPV: 11.2 fL (ref 7.5–12.5)
Monocytes Relative: 6.7 %
Neutro Abs: 8413 cells/uL — ABNORMAL HIGH (ref 1500–7800)
Neutrophils Relative %: 70.7 %
Platelets: 301 10*3/uL (ref 140–400)
RBC: 4.31 10*6/uL (ref 3.80–5.10)
RDW: 11.5 % (ref 11.0–15.0)
Total Lymphocyte: 18.6 %
WBC: 11.9 10*3/uL — ABNORMAL HIGH (ref 3.8–10.8)

## 2019-07-19 LAB — C-REACTIVE PROTEIN: CRP: 4.8 mg/L (ref <8.0)

## 2019-07-19 LAB — SEDIMENTATION RATE: Sed Rate: 6 mm/h (ref 0–20)

## 2019-07-20 LAB — C. DIFFICILE GDH AND TOXIN A/B
GDH ANTIGEN: NOT DETECTED
MICRO NUMBER:: 10284079
SPECIMEN QUALITY:: ADEQUATE
TOXIN A AND B: NOT DETECTED

## 2019-07-20 LAB — GASTROINTESTINAL PATHOGEN PANEL PCR
C. difficile Tox A/B, PCR: NOT DETECTED
Campylobacter, PCR: NOT DETECTED
Cryptosporidium, PCR: NOT DETECTED
E coli (ETEC) LT/ST PCR: NOT DETECTED
E coli (STEC) stx1/stx2, PCR: NOT DETECTED
E coli 0157, PCR: NOT DETECTED
Giardia lamblia, PCR: NOT DETECTED
Norovirus, PCR: NOT DETECTED
Rotavirus A, PCR: NOT DETECTED
Salmonella, PCR: NOT DETECTED
Shigella, PCR: NOT DETECTED

## 2019-07-20 NOTE — Assessment & Plan Note (Signed)
Pleasant 34 year old female with recurrent acute onset abdominal pain associated with vomiting and diarrhea.  CT findings as outlined above.  Latest CT showing more diffuse disease suggestive of infectious or inflammatory colitis.  She had antibiotic therapy while in the hospital in February with similar presentation.  No stool studies have been done to date.  She is on her 2nd-3rd day of antibiotic therapy which makes collecting stool studies at this time less than ideal but we will pursue.  Ultimately she will need a colonoscopy to evaluate abnormal colon seen on CT, will plan for deep sedation and polypharmacy.  I have discussed the risks, alternatives, benefits with regards to but not limited to the risk of reaction to medication, bleeding, infection, perforation and the patient is agreeable to proceed. Written consent to be obtained.  She will complete Cipro and Flagyl.  Follow-up stool studies.  Recheck labs specifically white blood cell count.  If symptoms do not resolve she will let us know.

## 2019-07-21 NOTE — Progress Notes (Signed)
CC'ED TO PCP 

## 2019-07-25 ENCOUNTER — Other Ambulatory Visit (HOSPITAL_COMMUNITY): Payer: 59

## 2019-07-26 ENCOUNTER — Encounter: Payer: Self-pay | Admitting: Gastroenterology

## 2019-07-27 ENCOUNTER — Other Ambulatory Visit: Payer: Self-pay | Admitting: Gastroenterology

## 2019-07-27 MED ORDER — DICYCLOMINE HCL 10 MG PO CAPS: 10.0000 mg | ORAL_CAPSULE | Freq: Three times a day (TID) | ORAL | 0 refills | Status: AC | PRN

## 2019-08-10 ENCOUNTER — Emergency Department (HOSPITAL_BASED_OUTPATIENT_CLINIC_OR_DEPARTMENT_OTHER)
Admission: EM | Admit: 2019-08-10 | Discharge: 2019-08-10 | Disposition: A | Discharge status: Home / Self Care | Payer: Medicaid Other | Attending: Emergency Medicine | Admitting: Emergency Medicine

## 2019-08-10 ENCOUNTER — Other Ambulatory Visit: Payer: Self-pay

## 2019-08-10 ENCOUNTER — Encounter (HOSPITAL_BASED_OUTPATIENT_CLINIC_OR_DEPARTMENT_OTHER): Payer: Self-pay | Admitting: *Deleted

## 2019-08-10 DIAGNOSIS — K529 Noninfective gastroenteritis and colitis, unspecified: Secondary | ICD-10-CM | POA: Insufficient documentation

## 2019-08-10 DIAGNOSIS — Z87891 Personal history of nicotine dependence: Secondary | ICD-10-CM | POA: Insufficient documentation

## 2019-08-10 DIAGNOSIS — R1110 Vomiting, unspecified: Secondary | ICD-10-CM | POA: Diagnosis present

## 2019-08-10 LAB — URINALYSIS, ROUTINE W REFLEX MICROSCOPIC
Bilirubin Urine: NEGATIVE
Glucose, UA: NEGATIVE mg/dL
Ketones, ur: 15 mg/dL — AB
Leukocytes,Ua: NEGATIVE
Nitrite: NEGATIVE
Protein, ur: 30 mg/dL — AB
Specific Gravity, Urine: >1.03 — ABNORMAL HIGH (ref 1.005–1.030)
pH: 6 (ref 5.0–8.0)

## 2019-08-10 LAB — URINALYSIS, MICROSCOPIC (REFLEX)

## 2019-08-10 LAB — CBC
HCT: 46.8 % — ABNORMAL HIGH (ref 36.0–46.0)
Hemoglobin: 15.3 g/dL — ABNORMAL HIGH (ref 12.0–15.0)
MCH: 30.7 pg (ref 26.0–34.0)
MCHC: 32.7 g/dL (ref 30.0–36.0)
MCV: 93.8 fL (ref 80.0–100.0)
Platelets: 320 10*3/uL (ref 150–400)
RBC: 4.99 MIL/uL (ref 3.87–5.11)
RDW: 11.9 % (ref 11.5–15.5)
WBC: 17.4 10*3/uL — ABNORMAL HIGH (ref 4.0–10.5)
nRBC: 0 % (ref 0.0–0.2)

## 2019-08-10 LAB — COMPREHENSIVE METABOLIC PANEL
ALT: 38 U/L (ref 0–44)
AST: 37 U/L (ref 15–41)
Albumin: 5 g/dL (ref 3.5–5.0)
Alkaline Phosphatase: 108 U/L (ref 38–126)
Anion gap: 16 — ABNORMAL HIGH (ref 5–15)
BUN: 20 mg/dL (ref 6–20)
CO2: 21 mmol/L — ABNORMAL LOW (ref 22–32)
Calcium: 10.3 mg/dL (ref 8.9–10.3)
Chloride: 101 mmol/L (ref 98–111)
Creatinine, Ser: 0.8 mg/dL (ref 0.44–1.00)
GFR calc Af Amer: >60 mL/min (ref >60)
GFR calc non Af Amer: >60 mL/min (ref >60)
Glucose, Bld: 127 mg/dL — ABNORMAL HIGH (ref 70–99)
Potassium: 4.1 mmol/L (ref 3.5–5.1)
Sodium: 138 mmol/L (ref 135–145)
Total Bilirubin: 0.2 mg/dL — ABNORMAL LOW (ref 0.3–1.2)
Total Protein: 8.8 g/dL — ABNORMAL HIGH (ref 6.5–8.1)

## 2019-08-10 LAB — LIPASE, BLOOD: Lipase: 49 U/L (ref 11–51)

## 2019-08-10 MED ORDER — ONDANSETRON HCL 4 MG PO TABS: 4.0000 mg | ORAL_TABLET | Freq: Four times a day (QID) | ORAL | 2 refills | Status: DC

## 2019-08-10 MED ORDER — SODIUM CHLORIDE 0.9% FLUSH
3.0000 mL | Freq: Once | INTRAVENOUS | Status: DC
  Filled 2019-08-10: qty 3

## 2019-08-10 MED ORDER — ONDANSETRON 4 MG PO TBDP
4.0000 mg | ORAL_TABLET | Freq: Once | ORAL | Status: AC | PRN
  Administered 2019-08-10: 4 mg via ORAL
  Filled 2019-08-10: qty 1

## 2019-08-10 NOTE — ED Provider Notes (Signed)
[facility] EMERGENCY DEPARTMENT Provider Note   CSN: [id number] Arrival date & time: 08/10/19  1717     History Chief Complaint  Patient presents with  . Emesis  . Diarrhea    Anna Blake is a 34 y.o. female.  Patient currently followed by gastroenterology Dr. Benard Rink in [place].  Patient's been struggling with intermittent bouts of colitis.  Exact cause not known.  Started in February.  She is scheduled for colonoscopy in June.  Patient had one episode of diarrhea today and several episodes of vomiting which is sort of her course.  She has had CAT scans x2 which have shown inflammatory colitis.  Etiology unknown.  She has had stool cultures that were negative checked for C. difficile.  And was on a course of Cipro and Flagyl.  Patient received Zofran here and feels better she does have some Zofran at home but needs some more.  Patient said she got a lot of improvement from Bentyl that gastroenterology started her on.  Patient denies any significant abdominal pain at this time.  No blood in the bowel movements.        Past Medical History:  Diagnosis Date  . Abnormal vaginal Pap smear   . Anxiety   . Cystic acne   . Depression   . GERD (gastroesophageal reflux disease)     Patient Active Problem List   Diagnosis Date Noted  . Abnormal CT scan, colon 07/18/2019  . Leukocytosis 07/18/2019  . Nausea with vomiting 06/04/2019  . Diarrhea 06/04/2019  . Irritable bowel syndrome with predominant constipation 06/04/2019  . Gastroenteritis 06/03/2019  . Mixed hyperlipidemia 05/25/2019  . GAD (generalized anxiety disorder) 03/15/2019  . Depression, recurrent (HCC) 03/15/2019  . Cystic acne vulgaris 03/15/2019  . BMI 36.0-36.9,adult 03/15/2019  . IUD (intrauterine device) in place 10/26/2018  . Former tobacco use 10/26/2018  . History of cholecystectomy 10/26/2018  . History of tonsillectomy 10/26/2018  . History of bariatric surgery 10/26/2018  . History of  abnormal cervical Pap smear 10/26/2018  . Anxiety and depression 10/26/2018  . Gastroesophageal reflux disease without esophagitis 10/26/2018  . Abnormal weight gain 10/26/2018    Past Surgical History:  Procedure Laterality Date  . carpel tunnel; release 02/03/19 Dr Amanda Pea  Bilateral   . CESAREAN SECTION    . CHOLECYSTECTOMY    . EXPLORATORY LAPAROTOMY     endometriosis  . LAPAROSCOPIC GASTRIC SLEEVE RESECTION  2014  . TONSILLECTOMY       OB History   No obstetric history on file.     Family History  Problem Relation Age of Onset  . Congestive Heart Failure Father   . Hyperlipidemia Father   . Hypertension Father   . Colon cancer Father        43  . Heart disease Maternal Grandmother   . Hypertension Maternal Grandmother   . Hyperlipidemia Maternal Grandmother   . Diabetes Maternal Grandmother   . Cancer Maternal Grandfather        prostate  . Colon cancer Other        great grandfather    Social History   Tobacco Use  . Smoking status: Former Games developer  . Smokeless tobacco: Never Used  Substance Use Topics  . Alcohol use: Not Currently    Comment: social   . Drug use: Never    Home Medications Prior to Admission medications   Medication Sig Start Date End Date Taking? Authorizing Provider  acetaminophen (TYLENOL) 325 MG tablet Take 2 tablets (  650 mg total) by mouth every 6 (six) hours as needed for mild pain (or temp > 100). 06/05/19   Juliet Rude, PA-C  Biotin 1000 MCG tablet Take 1,000 mcg by mouth daily.     [provider]  buPROPion (WELLBUTRIN XL) 150 MG 24 hr tablet TAKE 2 TABLETS BY MOUTH IN THE MORNING Patient taking differently: Take 300 mg by mouth daily.  03/15/19   Sonny Masters, FNP  dicyclomine (BENTYL) 10 MG capsule Take 1 capsule (10 mg total) by mouth 3 (three) times daily as needed for spasms (bloating, frequent stool). 07/27/19   Tiffany Kocher, PA-C  FLUoxetine (PROZAC) 40 MG capsule Take 1 capsule (40 mg total) by mouth daily.  03/15/19 07/26/19  Sonny Masters, FNP  ibuprofen (ADVIL) 200 MG tablet Take 400 mg by mouth daily as needed for headache.    [provider]  ondansetron (ZOFRAN) 4 MG tablet Take 1 tablet (4 mg total) by mouth every 6 (six) hours. 08/10/19   Vanetta Mulders, MD  pantoprazole (PROTONIX) 40 MG tablet Take 1 tablet (40 mg total) by mouth 2 (two) times daily. 03/15/19 07/26/19  Sonny Masters, FNP    Allergies    Z-pak [azithromycin]  Review of Systems   Review of Systems  Constitutional: Negative for chills and fever.  HENT: Negative for rhinorrhea and sore throat.   Eyes: Negative for visual disturbance.  Respiratory: Negative for cough and shortness of breath.   Cardiovascular: Negative for chest pain and leg swelling.  Gastrointestinal: Positive for diarrhea and vomiting. Negative for abdominal pain and nausea.  Genitourinary: Negative for dysuria.  Musculoskeletal: Negative for back pain and neck pain.  Skin: Negative for rash.  Neurological: Negative for dizziness, light-headedness and headaches.  Hematological: Does not bruise/bleed easily.  Psychiatric/Behavioral: Negative for confusion.    Physical Exam Updated Vital Signs BP 122/81 (BP Location: Right Arm)   Pulse 95   Temp 98.3 F (36.8 C) (Oral)   Resp 18   Ht 1.575 m (5\' 2" )   Wt 93.5 kg   SpO2 98%   BMI 37.70 kg/m   Physical Exam Vitals and nursing note reviewed.  Constitutional:      General: She is not in acute distress.    Appearance: Normal appearance. She is well-developed.  HENT:     Head: Normocephalic and atraumatic.  Eyes:     Conjunctiva/sclera: Conjunctivae normal.     Pupils: Pupils are equal, round, and reactive to light.  Cardiovascular:     Rate and Rhythm: Normal rate and regular rhythm.     Heart sounds: No murmur.  Pulmonary:     Effort: Pulmonary effort is normal. No respiratory distress.     Breath sounds: Normal breath sounds.  Abdominal:     General: There is no  distension.     Palpations: Abdomen is soft.     Tenderness: There is no abdominal tenderness.     Comments: Abdomen soft nontender  Musculoskeletal:        General: Normal range of motion.     Cervical back: Normal range of motion and neck supple.  Skin:    General: Skin is warm and dry.  Neurological:     General: No focal deficit present.     Mental Status: She is alert and oriented to person, place, and time.     Cranial Nerves: No cranial nerve deficit.     Sensory: No sensory deficit.     Motor: No  weakness.     ED Results / Procedures / Treatments   Labs (all labs ordered are listed, but only abnormal results are displayed) Labs Reviewed  COMPREHENSIVE METABOLIC PANEL - Abnormal; Notable for the following components:      Result Value   CO2 21 (*)    Glucose, Bld 127 (*)    Total Protein 8.8 (*)    Total Bilirubin 0.2 (*)    Anion gap 16 (*)    All other components within normal limits  CBC - Abnormal; Notable for the following components:   WBC 17.4 (*)    Hemoglobin 15.3 (*)    HCT 46.8 (*)    All other components within normal limits  URINALYSIS, ROUTINE W REFLEX MICROSCOPIC - Abnormal; Notable for the following components:   Specific Gravity, Urine >1.030 (*)    Hgb urine dipstick TRACE (*)    Ketones, ur 15 (*)    Protein, ur 30 (*)    All other components within normal limits  URINALYSIS, MICROSCOPIC (REFLEX) - Abnormal; Notable for the following components:   Bacteria, UA RARE (*)    All other components within normal limits  LIPASE, BLOOD    EKG None  Radiology No results found.  Procedures Procedures (including critical care time)  Medications Ordered in ED Medications  sodium chloride flush (NS) 0.9 % injection 3 mL (3 mLs Intravenous Not Given 08/10/19 1755)  ondansetron (ZOFRAN-ODT) disintegrating tablet 4 mg (4 mg Oral Given 08/10/19 1727)    ED Course  I have reviewed the triage vital signs and the nursing notes.  Pertinent labs &  imaging results that were available during my care of the patient were reviewed by me and considered in my medical decision making (see chart for details).    MDM Rules/Calculators/A&P                     Patient states that this is typical of what is been going on.  She just frustrated that they do not have any exact answers.  But a lot of her work-up was negative so far to include stool culture C. difficile.  Patient without significant abdominal pain.  Last had CT scan of her abdomen on March 20th.  And last seen by GI medicine on March 23.  Patient agrees that she does not feel that she needs a CT scan again today.  Overall feeling better we will give a prescription for Zofran which she says helps.  She will contact work gastroenterology in the morning.  They may want to do stool cultures again.  Final Clinical Impression(s) / ED Diagnoses Final diagnoses:  Colitis    Rx / DC Orders ED Discharge Orders         Ordered    ondansetron (ZOFRAN) 4 MG tablet  Every 6 hours     08/10/19 2111           Fredia Sorrow, MD 08/10/19 2119

## 2019-08-10 NOTE — Discharge Instructions (Signed)
Give Dr. Aura Fey office a call your gastroenterologist to see if they want to do some more stool cultures or whether they want to start antibiotics again.  Perhaps will be able to move up your colonoscopy.  Labs are there for them to see today.  Return for any new or worse symptoms.  Prescription for Zofran provided.

## 2019-08-10 NOTE — ED Triage Notes (Signed)
Vomiting and diarrhea 

## 2019-08-11 ENCOUNTER — Telehealth: Payer: Self-pay | Admitting: *Deleted

## 2019-08-11 ENCOUNTER — Other Ambulatory Visit: Payer: Self-pay

## 2019-08-11 ENCOUNTER — Encounter: Payer: Self-pay | Admitting: Gastroenterology

## 2019-08-11 DIAGNOSIS — R197 Diarrhea, unspecified: Secondary | ICD-10-CM

## 2019-08-11 DIAGNOSIS — D72829 Elevated white blood cell count, unspecified: Secondary | ICD-10-CM

## 2019-08-11 MED ORDER — LEVOFLOXACIN 500 MG PO TABS: 500.0000 mg | ORAL_TABLET | Freq: Every day | ORAL | 0 refills | Status: AC

## 2019-08-11 NOTE — Telephone Encounter (Signed)
No other recommendations at this time. Hopefully the abx will help her feel better until the TCS. I wanted to get the stool studies to make sure she didn't pick up CDiff with the in/out with the ED.  Further reccs to follow results.

## 2019-08-11 NOTE — Telephone Encounter (Signed)
Per pt advice request: I was seen in the emergency room last night again for colitis. My wbc was 17. My anion gap was elevated. Rx for zofran. Wanted me to f/u with you re: whether abx needs to be prescribed. Also, do I need to repeat stool being i have no abx in system at this time. Any openings for colonoscopy before June 10? I am beginning to be very frustrated. 3 ER visits in 3 months for the same thing. ------------------------  Patient was scheduled for TCS 4/1 and called on 3/23 to reschedule due to her job. LSL is on PAL today. Fowarding to EG.

## 2019-08-11 NOTE — Telephone Encounter (Signed)
Lmom waiting on a return call.  

## 2019-08-11 NOTE — Telephone Encounter (Signed)
Pt returned call. Pt had diarrhea or loose stool once yesterday and hasn't had it today. Due to pt having diarrhea a few times out of the week. Lab orders for placed and pt will pick containers up on the day the diarrhea starts back. Pt is aware that the antibiotic was sent to her pharmacy. Pt is going to start if the diarrhea starts back and call with an update. EG. If anything different needs to be done, please advise.   Pt is aware that she will be placed on a cancellation list for procedure.

## 2019-08-11 NOTE — Telephone Encounter (Signed)
Please ask her if she's having diarrhea currently. If so, yes let's check a GI Path panel (CDiff was recently done in the ED). Given that she's just had Cipro/Flagyl, I would normally send in Azithromycin 500 mg daily x 3 days, however she's allergic. So I'll send in Levaquin 500 mg daily x 3 days to start AFTER stools collected. She should also start an OTC probiotic (such as Align, Restora, Florastor, Culturelle, etc) for the next 1-2 months.  We can put her on the cancellation list for TCS. Unfortunately with the change in MDs, our schedule is overwhelmed. When she rescheduled for 4/1, getting another appointment at 2 months was pretty good given the situation, but we can call with any cancellation that will allow her to get in sooner.  Please call and follow-up with [name] on Monday.  Cc: LSL

## 2019-08-11 NOTE — Addendum Note (Signed)
Addended by: Delane Ginger, Isa Hitz A on: 08/11/2019 09:51 AM   Modules accepted: Orders

## 2019-08-11 NOTE — Telephone Encounter (Signed)
Left a detailed message for pt. Pt is aware that lab orders were placed and pt will collect stool culture when diarrhea starts back, antibiotic was sent to pts pharmacy .

## 2019-08-14 NOTE — Telephone Encounter (Signed)
Agree with advice provided.  °

## 2019-08-17 ENCOUNTER — Other Ambulatory Visit: Payer: Self-pay | Admitting: Gastroenterology

## 2019-08-17 ENCOUNTER — Encounter: Payer: Self-pay | Admitting: Gastroenterology

## 2019-08-17 MED ORDER — ONDANSETRON 4 MG PO TBDP: 4.0000 mg | ORAL_TABLET | ORAL | 0 refills | Status: DC | PRN

## 2019-08-23 ENCOUNTER — Ambulatory Visit: Payer: 59 | Admitting: Family Medicine

## 2019-08-28 ENCOUNTER — Telehealth: Payer: Self-pay | Admitting: *Deleted

## 2019-08-28 NOTE — Telephone Encounter (Signed)
I have sent patient [facility] message with pre-op/covid test appt details.

## 2019-08-28 NOTE — Telephone Encounter (Signed)
Patient on cancellation list for sooner procedure.  Called pt and offered 5/13 at 2:00pm. Patient states she will have to call back to see if she can. Will await call

## 2019-08-28 NOTE — Telephone Encounter (Signed)
Patient called back. She is able to move up to 5/13 at 2:00pm. Patient aware will place new prep instructions in [facility]. She will also get a new covid/pre-op appt as well. Called endo and LMOVM making aware of change.

## 2019-09-04 NOTE — Patient Instructions (Signed)
Your procedure is scheduled on: 09/07/2019  Report to [facility] at     12:30 PM.  Call this number if you have problems the morning of surgery: [phone]   Remember:              Follow Directions on the letter you received from Your Physician's office regarding the Bowel Prep              No Smoking the day of Procedure :   Take these medicines the morning of surgery with A SIP OF WATER:Wellbutrin, Prozac, and pantoprazole    Do not wear jewelry, make-up or nail polish.    Do not bring valuables to the hospital.  Contacts, dentures or bridgework may not be worn into surgery.  .   Patients discharged the day of surgery will not be allowed to drive home.     Colonoscopy, Adult, Care After This sheet gives you information about how to care for yourself after your procedure. Your health care provider may also give you more specific instructions. If you have problems or questions, contact your health care provider. What can I expect after the procedure? After the procedure, it is common to have:  A small amount of blood in your stool for 24 hours after the procedure.  Some gas.  Mild abdominal cramping or bloating.  Follow these instructions at home: General instructions   For the first 24 hours after the procedure: ? Do not drive or use machinery. ? Do not sign important documents. ? Do not drink alcohol. ? Do your regular daily activities at a slower pace than normal. ? Eat soft, easy-to-digest foods. ? Rest often.  Take over-the-counter or prescription medicines only as told by your health care provider.  It is up to you to get the results of your procedure. Ask your health care provider, or the department performing the procedure, when your results will be ready. Relieving cramping and bloating  Try walking around when you have cramps or feel bloated.  Apply heat to your abdomen as told by your health care provider. Use a heat source that your health care  provider recommends, such as a moist heat pack or a heating pad. ? Place a towel between your skin and the heat source. ? Leave the heat on for 20-30 minutes. ? Remove the heat if your skin turns bright red. This is especially important if you are unable to feel pain, heat, or cold. You may have a greater risk of getting burned. Eating and drinking  Drink enough fluid to keep your urine clear or pale yellow.  Resume your normal diet as instructed by your health care provider. Avoid heavy or fried foods that are hard to digest.  Avoid drinking alcohol for as long as instructed by your health care provider. Contact a health care provider if:  You have blood in your stool 2-3 days after the procedure. Get help right away if:  You have more than a small spotting of blood in your stool.  You pass large blood clots in your stool.  Your abdomen is swollen.  You have nausea or vomiting.  You have a fever.  You have increasing abdominal pain that is not relieved with medicine. This information is not intended to replace advice given to you by your health care provider. Make sure you discuss any questions you have with your health care provider. Document Released: 11/26/2003 Document Revised: 01/06/2016 Document Reviewed: 06/25/2015 Elsevier Interactive Patient Education  2018 [place].

## 2019-09-05 ENCOUNTER — Other Ambulatory Visit (HOSPITAL_COMMUNITY)
Admission: RE | Admit: 2019-09-05 | Discharge: 2019-09-05 | Disposition: A | Discharge status: Home / Self Care | Payer: Medicaid Other | Source: Ambulatory Visit | Attending: Internal Medicine | Admitting: Internal Medicine

## 2019-09-05 ENCOUNTER — Encounter (HOSPITAL_COMMUNITY)
Admission: RE | Admit: 2019-09-05 | Discharge: 2019-09-05 | Disposition: A | Discharge status: Home / Self Care | Payer: Medicaid Other | Source: Ambulatory Visit | Attending: Internal Medicine | Admitting: Internal Medicine

## 2019-09-05 ENCOUNTER — Encounter (HOSPITAL_COMMUNITY): Payer: Self-pay

## 2019-09-05 ENCOUNTER — Other Ambulatory Visit: Payer: Self-pay

## 2019-09-05 DIAGNOSIS — Z20822 Contact with and (suspected) exposure to covid-19: Secondary | ICD-10-CM | POA: Diagnosis not present

## 2019-09-05 DIAGNOSIS — Z01812 Encounter for preprocedural laboratory examination: Secondary | ICD-10-CM | POA: Diagnosis not present

## 2019-09-05 LAB — SARS CORONAVIRUS 2 (TAT 6-24 HRS): SARS Coronavirus 2: NEGATIVE

## 2019-09-05 LAB — HCG, SERUM, QUALITATIVE: Preg, Serum: NEGATIVE

## 2019-09-07 ENCOUNTER — Ambulatory Visit (HOSPITAL_COMMUNITY)
Admission: RE | Admit: 2019-09-07 | Discharge: 2019-09-07 | Disposition: A | Discharge status: Home / Self Care | Payer: Medicaid Other | Attending: Internal Medicine | Admitting: Internal Medicine

## 2019-09-07 ENCOUNTER — Ambulatory Visit (HOSPITAL_COMMUNITY): Payer: Medicaid Other | Admitting: Anesthesiology

## 2019-09-07 ENCOUNTER — Encounter (HOSPITAL_COMMUNITY): Payer: Self-pay | Admitting: Internal Medicine

## 2019-09-07 ENCOUNTER — Other Ambulatory Visit: Payer: Self-pay

## 2019-09-07 ENCOUNTER — Encounter (HOSPITAL_COMMUNITY)
Admission: RE | Disposition: A | Discharge status: Home / Self Care | Payer: Self-pay | Source: Home / Self Care | Attending: Internal Medicine

## 2019-09-07 DIAGNOSIS — Z79899 Other long term (current) drug therapy: Secondary | ICD-10-CM | POA: Diagnosis not present

## 2019-09-07 DIAGNOSIS — K635 Polyp of colon: Secondary | ICD-10-CM | POA: Insufficient documentation

## 2019-09-07 DIAGNOSIS — K219 Gastro-esophageal reflux disease without esophagitis: Secondary | ICD-10-CM | POA: Insufficient documentation

## 2019-09-07 DIAGNOSIS — F329 Major depressive disorder, single episode, unspecified: Secondary | ICD-10-CM | POA: Insufficient documentation

## 2019-09-07 DIAGNOSIS — R197 Diarrhea, unspecified: Secondary | ICD-10-CM | POA: Insufficient documentation

## 2019-09-07 DIAGNOSIS — Z87891 Personal history of nicotine dependence: Secondary | ICD-10-CM | POA: Insufficient documentation

## 2019-09-07 DIAGNOSIS — F419 Anxiety disorder, unspecified: Secondary | ICD-10-CM | POA: Diagnosis not present

## 2019-09-07 DIAGNOSIS — R933 Abnormal findings on diagnostic imaging of other parts of digestive tract: Secondary | ICD-10-CM | POA: Insufficient documentation

## 2019-09-07 DIAGNOSIS — Q438 Other specified congenital malformations of intestine: Secondary | ICD-10-CM | POA: Insufficient documentation

## 2019-09-07 HISTORY — PX: POLYPECTOMY: SHX5525

## 2019-09-07 HISTORY — PX: BIOPSY: SHX5522

## 2019-09-07 HISTORY — PX: COLONOSCOPY WITH PROPOFOL: SHX5780

## 2019-09-07 SURGERY — COLONOSCOPY WITH PROPOFOL
Anesthesia: General

## 2019-09-07 MED ORDER — CHLORHEXIDINE GLUCONATE CLOTH 2 % EX PADS: 6.0000 | MEDICATED_PAD | Freq: Once | CUTANEOUS | Status: DC

## 2019-09-07 MED ORDER — PROPOFOL 10 MG/ML IV BOLUS
INTRAVENOUS | Status: DC | PRN
  Administered 2019-09-07: 30 mg via INTRAVENOUS
  Administered 2019-09-07: 20 mg via INTRAVENOUS
  Administered 2019-09-07: 50 mg via INTRAVENOUS
  Administered 2019-09-07: 30 mg via INTRAVENOUS
  Administered 2019-09-07: 20 mg via INTRAVENOUS
  Administered 2019-09-07: 50 mg via INTRAVENOUS

## 2019-09-07 MED ORDER — PROPOFOL 10 MG/ML IV BOLUS
INTRAVENOUS | Status: AC
  Filled 2019-09-07: qty 40

## 2019-09-07 MED ORDER — LACTATED RINGERS IV SOLN: Freq: Once | INTRAVENOUS | Status: AC

## 2019-09-07 MED ORDER — PROPOFOL 500 MG/50ML IV EMUL
INTRAVENOUS | Status: DC | PRN
  Administered 2019-09-07: 150 ug/kg/min via INTRAVENOUS

## 2019-09-07 NOTE — Anesthesia Preprocedure Evaluation (Signed)
Anesthesia Evaluation  Patient identified by MRN, date of birth, ID band Patient awake    Reviewed: Allergy & Precautions, NPO status , Patient's Chart, lab work & pertinent test results  Airway Mallampati: II  TM Distance: >3 FB Neck ROM: Full    Dental  (+) Teeth Intact, Dental Advisory Given   Pulmonary former smoker,    Pulmonary exam normal breath sounds clear to auscultation       Cardiovascular Exercise Tolerance: Good Normal cardiovascular exam Rhythm:Regular Rate:Normal     Neuro/Psych PSYCHIATRIC DISORDERS Anxiety Depression    GI/Hepatic Neg liver ROS, GERD  Medicated and Controlled,H/o gastric sleeve   Endo/Other  negative endocrine ROS  Renal/GU negative Renal ROS     Musculoskeletal negative musculoskeletal ROS (+)   Abdominal   Peds negative pediatric ROS (+)  Hematology negative hematology ROS (+)   Anesthesia Other Findings   Reproductive/Obstetrics negative OB ROS                             Anesthesia Physical Anesthesia Plan  ASA: II  Anesthesia Plan: General   Post-op Pain Management:    Induction: Intravenous  PONV Risk Score and Plan: 2 and TIVA  Airway Management Planned: Nasal Cannula, Natural Airway and Simple Face Mask  Additional Equipment:   Intra-op Plan:   Post-operative Plan:   Informed Consent: I have reviewed the patients History and Physical, chart, labs and discussed the procedure including the risks, benefits and alternatives for the proposed anesthesia with the patient or authorized representative who has indicated his/her understanding and acceptance.     Dental advisory given  Plan Discussed with: CRNA and Surgeon  Anesthesia Plan Comments:         Anesthesia Quick Evaluation

## 2019-09-07 NOTE — H&P (Signed)
@LOGO @   Primary Care Physician:  , FNP Primary Gastroenterologist:  Dr. Sonny Masters  Pre-Procedure History & Physical: HPI:  Anna Blake is a 34 y.o. female here for further evaluation of bloating diarrhea and abnormal CT scan on 2 different studies via colonoscopy.  She is never passed any blood per rectum.  GIP and C. difficile negative.  Past Medical History:  Diagnosis Date  . Abnormal vaginal Pap smear   . Anxiety   . Cystic acne   . Depression   . GERD (gastroesophageal reflux disease)     Past Surgical History:  Procedure Laterality Date  . carpel tunnel; release 02/03/19 Dr 04/05/19  Bilateral   . CESAREAN SECTION    . CHOLECYSTECTOMY    . EXPLORATORY LAPAROTOMY     endometriosis  . LAPAROSCOPIC GASTRIC SLEEVE RESECTION  2014  . TONSILLECTOMY      Prior to Admission medications   Medication Sig Start Date End Date Taking? Authorizing Provider  acetaminophen (TYLENOL) 325 MG tablet Take 2 tablets (650 mg total) by mouth every 6 (six) hours as needed for mild pain (or temp > 100). 06/05/19  Yes 08/03/19, PA-C  Biotin 1000 MCG tablet Take 1,000 mcg by mouth daily.    Yes [provider]  buPROPion (WELLBUTRIN XL) 150 MG 24 hr tablet TAKE 2 TABLETS BY MOUTH IN THE MORNING Patient taking differently: Take 300 mg by mouth daily.  03/15/19  Yes Rakes, 03/17/19, FNP  dicyclomine (BENTYL) 10 MG capsule Take 1 capsule (10 mg total) by mouth 3 (three) times daily as needed for spasms (bloating, frequent stool). 07/27/19  Yes 09/26/19, PA-C  FLUoxetine (PROZAC) 40 MG capsule Take 1 capsule (40 mg total) by mouth daily. 03/15/19 08/29/19 Yes Rakes, 10/29/19, FNP  ibuprofen (ADVIL) 200 MG tablet Take 400 mg by mouth daily as needed for headache.   Yes [provider]  ondansetron (ZOFRAN ODT) 4 MG disintegrating tablet Take 1 tablet (4 mg total) by mouth every 4 (four) hours as needed for nausea or vomiting. 08/17/19  Yes 08/19/19, PA-C   pantoprazole (PROTONIX) 40 MG tablet Take 1 tablet (40 mg total) by mouth 2 (two) times daily. 03/15/19 08/29/19 Yes Rakes, 10/29/19, FNP  Probiotic Product (PROBIOTIC DAILY PO) Take 1 tablet by mouth daily. [facility] colon health   Yes [provider]    Allergies as of 07/18/2019 - Review Complete 07/18/2019  Allergen Reaction Noted  . Z-pak [azithromycin] Hives 10/24/2018    Family History  Problem Relation Age of Onset  . Congestive Heart Failure Father   . Hyperlipidemia Father   . Hypertension Father   . Colon cancer Father        53  . Heart disease Maternal Grandmother   . Hypertension Maternal Grandmother   . Hyperlipidemia Maternal Grandmother   . Diabetes Maternal Grandmother   . Cancer Maternal Grandfather        prostate  . Colon cancer Other        great grandfather    Social History   Socioeconomic History  . Marital status: Single    Spouse name: Not on file  . Number of children: 1  . Years of education: Not on file  . Highest education level: Not on file  Occupational History  . Not on file  Tobacco Use  . Smoking status: Former 59  . Smokeless tobacco: Never Used  Substance and Sexual Activity  . Alcohol use:  Not Currently    Comment: social   . Drug use: Never  . Sexual activity: Yes    Partners: Male  Other Topics Concern  . Not on file  Social History Narrative  . Not on file   Social Determinants of Health   Financial Resource Strain:   . Difficulty of Paying Living Expenses:   Food Insecurity:   . Worried About Charity fundraiser in the Last Year:   . Arboriculturist in the Last Year:   Transportation Needs:   . Film/video editor (Medical):   Marland Kitchen Lack of Transportation (Non-Medical):   Physical Activity:   . Days of Exercise per Week:   . Minutes of Exercise per Session:   Stress:   . Feeling of Stress :   Social Connections:   . Frequency of Communication with Friends and Family:   . Frequency of Social  Gatherings with Friends and Family:   . Attends Religious Services:   . Active Member of Clubs or Organizations:   . Attends Archivist Meetings:   Marland Kitchen Marital Status:   Intimate Partner Violence:   . Fear of Current or Ex-Partner:   . Emotionally Abused:   Marland Kitchen Physically Abused:   . Sexually Abused:     Review of Systems: See HPI, otherwise negative ROS  Physical Exam: BP (!) 122/95   Pulse 96   Temp 97.8 F (36.6 C) (Oral)   Resp 18   SpO2 98%  General:   Alert,  Well-developed, well-nourished, pleasant and cooperative in NAD Neck:  Supple; no masses or thyromegaly. No significant cervical adenopathy. Lungs:  Clear throughout to auscultation.   No wheezes, crackles, or rhonchi. No acute distress. Heart:  Regular rate and rhythm; no murmurs, clicks, rubs,  or gallops. Abdomen: Non-distended, normal bowel sounds.  Soft and nontender without appreciable mass or hepatosplenomegaly.  Pulses:  Normal pulses noted. Extremities:  Without clubbing or edema.  Impression/Plan: 34 year old lady with recent gastroenteritis-protracted course.  Abnormal appearing colon on 2 different cross-sectional imaging studies. Here for colonoscopy to further evaluate.  The risks, benefits, limitations, alternatives and imponderables have been reviewed with the patient. Questions have been answered. All parties are agreeable.      Notice: This dictation was prepared with Dragon dictation along with smaller phrase technology. Any transcriptional errors that result from this process are unintentional and may not be corrected upon review.

## 2019-09-07 NOTE — Transfer of Care (Signed)
Immediate Anesthesia Transfer of Care Note  Patient: Anna Blake  Procedure(s) Performed: COLONOSCOPY WITH PROPOFOL (N/A ) BIOPSY POLYPECTOMY  Patient Location: PACU  Anesthesia Type:General  Level of Consciousness: awake  Airway & Oxygen Therapy: Patient Spontanous Breathing  Post-op Assessment: Report given to RN  Post vital signs: Reviewed  Last Vitals:  Vitals Value Taken Time  BP 112/66 09/07/19 1202  Temp    Pulse 88 09/07/19 1204  Resp 18 09/07/19 1204  SpO2 100 % 09/07/19 1204  Vitals shown include unvalidated device data.  Last Pain:  Vitals:   09/07/19 1123  TempSrc:   PainSc: 0-No pain         Complications: No apparent anesthesia complications

## 2019-09-07 NOTE — Op Note (Addendum)
[facility] Patient Name: Anna Blake Procedure Date: 09/07/2019 11:13 AM MRN: [id number] Date of Birth: 1985-09-23 Attending MD: Gennette Pac , MD CSN: [id number] Age: 34 Admit Type: Outpatient Procedure:                Colonoscopy Indications:              Abnormal CT of the GI tract Providers:                Gennette Pac, MD, Judee Clara, RN,                            Pandora Leiter, Technician Referring MD:              Medicines:                Propofol per Anesthesia Complications:            No immediate complications. Estimated Blood Loss:     Estimated blood loss was minimal. Procedure:                Pre-Anesthesia Assessment:                           - Prior to the procedure, a History and Physical                            was performed, and patient medications and                            allergies were reviewed. The patient's tolerance of                            previous anesthesia was also reviewed. The risks                            and benefits of the procedure and the sedation                            options and risks were discussed with the patient.                            All questions were answered, and informed consent                            was obtained. Prior Anticoagulants: The patient has                            taken no previous anticoagulant or antiplatelet                            agents. ASA Grade Assessment: II - A patient with                            mild systemic disease. After reviewing the risks  and benefits, the patient was deemed in                            satisfactory condition to undergo the procedure.                           After obtaining informed consent, the colonoscope                            was passed under direct vision. Throughout the                            procedure, the patient's blood pressure, pulse, and                            oxygen  saturations were monitored continuously. The                            CF-[id number] ([id number]) scope was introduced through                            the anus and advanced to the 15 cm into the ileum.                            The colonoscopy was performed without difficulty.                            The patient tolerated the procedure well. The                            quality of the bowel preparation was adequate. Scope In: 11:28:37 AM Scope Out: 11:56:29 AM Scope Withdrawal Time: 0 hours 9 minutes 43 seconds  Total Procedure Duration: 0 hours 27 minutes 52 seconds  Findings:      The perianal and digital rectal examinations were normal. Patient's       colon was redundant. Changing of the patient's position and external       abdominal pressure required to reach the cecum.      A 3 mm polyp was found in the descending colon. The polyp was sessile.       The polyp was removed with a cold biopsy forceps. Resection and       retrieval were complete. Estimated blood loss was minimal.      The exam was otherwise without abnormality on direct and retroflexion       views. Segmental biopsies of the right and left colon taken for       histologic study Impression:               - One 3 mm polyp in the descending colon, removed                            with a cold biopsy forceps. Resected and retrieved.                           - The examination was otherwise normal  on direct                            and retroflexion views. Redundant colon. Status                            post segmental biopsy. I suspect a resolved                            infectious colitis with patient being left with                            postinfectious irritable bowel syndrome Moderate Sedation:      Moderate (conscious) sedation was personally administered by an       anesthesia professional. The following parameters were monitored: oxygen       saturation, heart rate, blood pressure, respiratory rate,  EKG, adequacy       of pulmonary ventilation, and response to care. Recommendation:           - Patient has a contact number available for                            emergencies. The signs and symptoms of potential                            delayed complications were discussed with the                            patient. Return to normal activities tomorrow.                            Written discharge instructions were provided to the                            patient.                           - Continue present medications.                           - - Return to GI office in 4 weeks. Follow-up on                            pathology. Timing of repeat colonoscopy based on                            pathology report. Procedure Code(s):        --- Professional ---                           [phone], Colonoscopy, flexible; with biopsy, single                            or multiple Diagnosis Code(s):        --- Professional ---  K63.5, Polyp of colon                           R93.3, Abnormal findings on diagnostic imaging of                            other parts of digestive tract CPT copyright 2019 American [employer]. All rights reserved. The codes documented in this report are preliminary and upon coder review may  be revised to meet current compliance requirements. Anna Blake. Anna Mclaren, MD Anna Richards, MD 09/07/2019 12:13:47 PM This report has been signed electronically. Number of Addenda: 0

## 2019-09-07 NOTE — Anesthesia Postprocedure Evaluation (Signed)
Anesthesia Post Note  Patient: Anna Blake  Procedure(s) Performed: COLONOSCOPY WITH PROPOFOL (N/A ) BIOPSY POLYPECTOMY  Patient location during evaluation: PACU Anesthesia Type: General Level of consciousness: awake and alert and oriented Pain management: pain level controlled Vital Signs Assessment: post-procedure vital signs reviewed and stable Respiratory status: spontaneous breathing Cardiovascular status: blood pressure returned to baseline and stable Anesthetic complications: no     Last Vitals:  Vitals:   09/07/19 1107  BP: (!) 122/95  Pulse: 96  Resp: 18  Temp: 36.6 C  SpO2: 98%    Last Pain:  Vitals:   09/07/19 1123  TempSrc:   PainSc: 0-No pain                 Sherral Dirocco

## 2019-09-07 NOTE — Discharge Instructions (Signed)
Colonoscopy Discharge Instructions  Read the instructions outlined below and refer to this sheet in the next few weeks. These discharge instructions provide you with general information on caring for yourself after you leave the hospital. Your doctor may also give you specific instructions. While your treatment has been planned according to the most current medical practices available, unavoidable complications occasionally occur. If you have any problems or questions after discharge, call Dr. Gala Romney at [phone]. ACTIVITY  You may resume your regular activity, but move at a slower pace for the next 24 hours.   Take frequent rest periods for the next 24 hours.   Walking will help get rid of the air and reduce the bloated feeling in your belly (abdomen).   No driving for 24 hours (because of the medicine (anesthesia) used during the test).    Do not sign any important legal documents or operate any machinery for 24 hours (because of the anesthesia used during the test).  NUTRITION  Drink plenty of fluids.   You may resume your normal diet as instructed by your doctor.   Begin with a light meal and progress to your normal diet. Heavy or fried foods are harder to digest and may make you feel sick to your stomach (nauseated).   Avoid alcoholic beverages for 24 hours or as instructed.  MEDICATIONS  You may resume your normal medications unless your doctor tells you otherwise.  WHAT YOU CAN EXPECT TODAY  Some feelings of bloating in the abdomen.   Passage of more gas than usual.   Spotting of blood in your stool or on the toilet paper.  IF YOU HAD POLYPS REMOVED DURING THE COLONOSCOPY:  No aspirin products for 7 days or as instructed.   No alcohol for 7 days or as instructed.   Eat a soft diet for the next 24 hours.  FINDING OUT THE RESULTS OF YOUR TEST Not all test results are available during your visit. If your test results are not back during the visit, make an appointment  with your caregiver to find out the results. Do not assume everything is normal if you have not heard from your caregiver or the medical facility. It is important for you to follow up on all of your test results.  SEEK IMMEDIATE MEDICAL ATTENTION IF:  You have more than a spotting of blood in your stool.   Your belly is swollen (abdominal distention).   You are nauseated or vomiting.   You have a temperature over 101.   You have abdominal pain or discomfort that is severe or gets worse throughout the day.    Colon Polyps  Polyps are tissue growths inside the body. Polyps can grow in many places, including the large intestine (colon). A polyp may be a round bump or a mushroom-shaped growth. You could have one polyp or several. Most colon polyps are noncancerous (benign). However, some colon polyps can become cancerous over time. Finding and removing the polyps early can help prevent this. What are the causes? The exact cause of colon polyps is not known. What increases the risk? You are more likely to develop this condition if you:  Have a family history of colon cancer or colon polyps.  Are older than 67 or older than 45 if you are African American.  Have inflammatory bowel disease, such as ulcerative colitis or Crohn's disease.  Have certain hereditary conditions, such as: ? Familial adenomatous polyposis. ? Lynch syndrome. ? Turcot syndrome. ? Peutz-Jeghers syndrome.  Are  overweight.  Smoke cigarettes.  Do not get enough exercise.  Drink too much alcohol.  Eat a diet that is high in fat and red meat and low in fiber.  Had childhood cancer that was treated with abdominal radiation. What are the signs or symptoms? Most polyps do not cause symptoms. If you have symptoms, they may include:  Blood coming from your rectum when having a bowel movement.  Blood in your stool. The stool may look dark red or black.  Abdominal pain.  A change in bowel habits, such as  constipation or diarrhea. How is this diagnosed? This condition is diagnosed with a colonoscopy. This is a procedure in which a lighted, flexible scope is inserted into the anus and then passed into the colon to examine the area. Polyps are sometimes found when a colonoscopy is done as part of routine cancer screening tests. How is this treated? Treatment for this condition involves removing any polyps that are found. Most polyps can be removed during a colonoscopy. Those polyps will then be tested for cancer. Additional treatment may be needed depending on the results of testing. Follow these instructions at home: Lifestyle  Maintain a healthy weight, or lose weight if recommended by your health care provider.  Exercise every day or as told by your health care provider.  Do not use any products that contain nicotine or tobacco, such as cigarettes and e-cigarettes. If you need help quitting, ask your health care provider.  If you drink alcohol, limit how much you have: ? 0-1 drink a day for women. ? 0-2 drinks a day for men.  Be aware of how much alcohol is in your drink. In the [place] one drink equals one 12 oz bottle of beer (355 mL), one 5 oz glass of wine (148 mL), or one 1 oz shot of hard liquor (44 mL). Eating and drinking   Eat foods that are high in fiber, such as fruits, vegetables, and whole grains.  Eat foods that are high in calcium and vitamin D, such as milk, cheese, yogurt, eggs, liver, fish, and broccoli.  Limit foods that are high in fat, such as fried foods and desserts.  Limit the amount of red meat and processed meat you eat, such as hot dogs, sausage, bacon, and lunch meats. General instructions  Keep all follow-up visits as told by your health care provider. This is important. ? This includes having regularly scheduled colonoscopies. ? Talk to your health care provider about when you need a colonoscopy. Contact a health care provider if:  You have new or  worsening bleeding during a bowel movement.  You have new or increased blood in your stool.  You have a change in bowel habits.  You lose weight for no known reason. Summary  Polyps are tissue growths inside the body. Polyps can grow in many places, including the colon.  Most colon polyps are noncancerous (benign), but some can become cancerous over time.  This condition is diagnosed with a colonoscopy.  Treatment for this condition involves removing any polyps that are found. Most polyps can be removed during a colonoscopy. This information is not intended to replace advice given to you by your health care provider. Make sure you discuss any questions you have with your health care provider. Document Revised: 07/29/2017 Document Reviewed: 07/29/2017 Elsevier Patient Education  [place].   1 polyp removed from your colon.  Biopsies of your colon lining taken.  Further recommendations to follow pending review of  pathology report  At patient request, I called Secundino Ginger at [phone] and reviewed results  Office visit with [name] in 4 weeks

## 2019-09-08 ENCOUNTER — Encounter: Payer: Self-pay | Admitting: Internal Medicine

## 2019-09-08 LAB — SURGICAL PATHOLOGY

## 2019-09-11 ENCOUNTER — Other Ambulatory Visit: Payer: Self-pay | Admitting: Gastroenterology

## 2019-09-11 DIAGNOSIS — D72829 Elevated white blood cell count, unspecified: Secondary | ICD-10-CM

## 2019-10-03 ENCOUNTER — Other Ambulatory Visit (HOSPITAL_COMMUNITY): Payer: 59

## 2019-10-03 ENCOUNTER — Other Ambulatory Visit (HOSPITAL_COMMUNITY): Payer: Medicaid Other

## 2019-10-05 ENCOUNTER — Ambulatory Visit: Payer: Medicaid Other | Admitting: Gastroenterology

## 2019-11-21 ENCOUNTER — Other Ambulatory Visit: Payer: Self-pay

## 2019-11-21 DIAGNOSIS — F339 Major depressive disorder, recurrent, unspecified: Secondary | ICD-10-CM

## 2019-11-21 DIAGNOSIS — F411 Generalized anxiety disorder: Secondary | ICD-10-CM

## 2019-11-21 MED ORDER — FLUOXETINE HCL 40 MG PO CAPS: 40.0000 mg | ORAL_CAPSULE | Freq: Every day | ORAL | 0 refills | Status: DC

## 2019-11-21 MED ORDER — BUPROPION HCL ER (XL) 150 MG PO TB24: ORAL_TABLET | ORAL | 0 refills | Status: DC

## 2020-01-08 ENCOUNTER — Ambulatory Visit: Payer: Medicaid Other | Admitting: Family Medicine

## 2020-01-08 ENCOUNTER — Encounter: Payer: Self-pay | Admitting: Family Medicine

## 2020-01-08 ENCOUNTER — Other Ambulatory Visit: Payer: Self-pay

## 2020-01-08 VITALS — BP 123/84 | HR 86 | Temp 97.8°F | Ht 62.0 in | Wt 200.8 lb

## 2020-01-08 DIAGNOSIS — F339 Major depressive disorder, recurrent, unspecified: Secondary | ICD-10-CM | POA: Diagnosis not present

## 2020-01-08 DIAGNOSIS — K2100 Gastro-esophageal reflux disease with esophagitis, without bleeding: Secondary | ICD-10-CM

## 2020-01-08 DIAGNOSIS — F411 Generalized anxiety disorder: Secondary | ICD-10-CM

## 2020-01-08 MED ORDER — BUPROPION HCL ER (XL) 150 MG PO TB24: ORAL_TABLET | ORAL | 1 refills | Status: AC

## 2020-01-08 MED ORDER — FLUOXETINE HCL 40 MG PO CAPS: 40.0000 mg | ORAL_CAPSULE | Freq: Every day | ORAL | 1 refills | Status: DC

## 2020-01-08 MED ORDER — ESOMEPRAZOLE MAGNESIUM 40 MG PO CPDR
40.0000 mg | DELAYED_RELEASE_CAPSULE | Freq: Two times a day (BID) | ORAL | 1 refills | Status: DC
Start: 2020-01-08 — End: 2020-09-04

## 2020-01-08 NOTE — Progress Notes (Signed)
Subjective:  Patient ID: Anna Blake, female    DOB: 1985-12-15  Age: 34 y.o. MRN: [id number]  CC: Follow-up   HPI Anna Blake presents for first-time visit with me as her provider.  She needs to have refills for Prozac and Wellbutrin for her OCD.  She says she has been taking these for many years.  They work well for her.  They keep her from having symptomatic disease.  They agree with her without side effects. She states she moved to the area about a year and half ago she was trying to get her son self into the school system since he is autistic.  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  But still has reflux fairly frequently and has to use over-the-counter Pepcid AC.  That does not always hold it either.  She has significant heartburn with spicy foods and subtypes regular foods.  Depression screen [facility] 2/9 01/08/2020 05/25/2019 03/15/2019  Decreased Interest 0 0 0  Down, Depressed, Hopeless 0 1 0  PHQ - 2 Score 0 1 0  Altered sleeping - - -  Tired, decreased energy - - -  Change in appetite - - -  Feeling bad or failure about yourself  - - -  Trouble concentrating - - -  Moving slowly or fidgety/restless - - -  Suicidal thoughts - - -  PHQ-9 Score - - -  Difficult doing work/chores - - -    History Anna Blake has a past medical history of Abnormal vaginal Pap smear, Anxiety, Cystic acne, Depression, and GERD (gastroesophageal reflux disease).   She has a past surgical history that includes Cholecystectomy; Laparoscopic gastric sleeve resection (2014); Cesarean section; Tonsillectomy; carpel tunnel; release 02/03/19 Dr Amedeo Plenty  (Bilateral); Exploratory laparotomy; Colonoscopy with propofol (N/A, 09/07/2019); biopsy (09/07/2019); and polypectomy (09/07/2019).   Her family history includes Cancer  in her maternal grandfather; Colon cancer in her father and another family member; Congestive Heart Failure in her father; Diabetes in her maternal grandmother; Heart disease in her maternal grandmother; Hyperlipidemia in her father and maternal grandmother; Hypertension in her father and maternal grandmother.She reports that she has quit smoking. She has never used smokeless tobacco. She reports previous alcohol use. She reports that she does not use drugs.    ROS Review of Systems  Constitutional: Negative.   HENT: Negative.   Eyes: Negative for visual disturbance.  Respiratory: Negative for shortness of breath.   Cardiovascular: Negative for chest pain.  Gastrointestinal: Positive for abdominal pain.  Musculoskeletal: Negative for arthralgias.    Objective:  BP 123/84   Pulse 86   Temp 97.8 F (36.6 C) (Temporal)   Ht 5' 2"  (1.575 m)   Wt 200 lb 12.8 oz (91.1 kg)   BMI 36.73 kg/m   BP Readings from Last 3 Encounters:  01/08/20 123/84  09/07/19 106/73  09/05/19 113/82    Wt Readings from Last 3 Encounters:  01/08/20 200 lb 12.8 oz (91.1 kg)  09/05/19 200 lb (90.7 kg)  08/10/19 206 lb 2.1 oz (93.5 kg)     Physical Exam Constitutional:      General: She is not in acute distress.    Appearance: She is well-developed.  HENT:     Head: Normocephalic and atraumatic.  Eyes:  Conjunctiva/sclera: Conjunctivae normal.     Pupils: Pupils are equal, round, and reactive to light.  Neck:     Thyroid: No thyromegaly.  Cardiovascular:     Rate and Rhythm: Normal rate and regular rhythm.     Heart sounds: Normal heart sounds. No murmur heard.   Pulmonary:     Effort: Pulmonary effort is normal. No respiratory distress.     Breath sounds: Normal breath sounds. No wheezing or rales.  Abdominal:     General: Bowel sounds are normal. There is no distension.     Palpations: Abdomen is soft.     Tenderness: There is abdominal tenderness.  Musculoskeletal:        General:  Normal range of motion.     Cervical back: Normal range of motion and neck supple.  Lymphadenopathy:     Cervical: No cervical adenopathy.  Skin:    General: Skin is warm and dry.  Neurological:     Mental Status: She is alert and oriented to person, place, and time.  Psychiatric:        Behavior: Behavior normal.        Thought Content: Thought content normal.        Judgment: Judgment normal.       Assessment & Plan:   Anna Blake was seen today for follow-up.  Diagnoses and all orders for this visit:  GAD (generalized anxiety disorder) -     CBC with Differential/Platelet -     CMP14+EGFR -     buPROPion (WELLBUTRIN XL) 150 MG 24 hr tablet; TAKE 2 TABLETS BY MOUTH IN THE MORNING. -     FLUoxetine (PROZAC) 40 MG capsule; Take 1 capsule (40 mg total) by mouth daily.  Depression, recurrent ([place]) -     CBC with Differential/Platelet -     CMP14+EGFR -     buPROPion (WELLBUTRIN XL) 150 MG 24 hr tablet; TAKE 2 TABLETS BY MOUTH IN THE MORNING. -     FLUoxetine (PROZAC) 40 MG capsule; Take 1 capsule (40 mg total) by mouth daily.  Gastroesophageal reflux disease with esophagitis without hemorrhage -     CBC with Differential/Platelet -     CMP14+EGFR  Other orders -     esomeprazole (NEXIUM) 40 MG capsule; Take 1 capsule (40 mg total) by mouth 2 (two) times daily. On an empty stomach       I have discontinued Anna Blake's pantoprazole, ondansetron, and Probiotic Product (PROBIOTIC DAILY PO). I have also changed her buPROPion and FLUoxetine. Additionally, I am having her start on esomeprazole. Lastly, I am having her maintain her Biotin, ibuprofen, acetaminophen, and dicyclomine.  Allergies as of 01/08/2020      Reactions   Z-pak [azithromycin] Hives      Medication List       Accurate as of January 08, 2020 10:17 PM. If you have any questions, ask your nurse or doctor.        STOP taking these medications   ondansetron 4 MG disintegrating tablet Commonly known  as: Zofran ODT Stopped by: Claretta Fraise, MD   pantoprazole 40 MG tablet Commonly known as: PROTONIX Stopped by: Claretta Fraise, MD   PROBIOTIC DAILY PO Stopped by: Claretta Fraise, MD     TAKE these medications   acetaminophen 325 MG tablet Commonly known as: TYLENOL Take 2 tablets (650 mg total) by mouth every 6 (six) hours as needed for mild pain (or temp > 100).   Biotin 1000 MCG tablet Take 1,000 mcg  by mouth daily.   buPROPion 150 MG 24 hr tablet Commonly known as: WELLBUTRIN XL TAKE 2 TABLETS BY MOUTH IN THE MORNING. What changed: additional instructions Changed by: Claretta Fraise, MD   dicyclomine 10 MG capsule Commonly known as: BENTYL Take 1 capsule (10 mg total) by mouth 3 (three) times daily as needed for spasms (bloating, frequent stool).   esomeprazole 40 MG capsule Commonly known as: NexIUM Take 1 capsule (40 mg total) by mouth 2 (two) times daily. On an empty stomach Started by: Claretta Fraise, MD   FLUoxetine 40 MG capsule Commonly known as: PROZAC Take 1 capsule (40 mg total) by mouth daily. What changed: additional instructions Changed by: Claretta Fraise, MD   ibuprofen 200 MG tablet Commonly known as: ADVIL Take 400 mg by mouth daily as needed for headache.        Follow-up: Return in about 6 months (around 07/07/2020).  Claretta Fraise, M.D.

## 2020-01-09 LAB — CBC WITH DIFFERENTIAL/PLATELET
Basophils Absolute: 0.1 10*3/uL (ref 0.0–0.2)
Basos: 1 %
EOS (ABSOLUTE): 0.4 10*3/uL (ref 0.0–0.4)
Eos: 3 %
Hematocrit: 42 % (ref 34.0–46.6)
Hemoglobin: 14.4 g/dL (ref 11.1–15.9)
Immature Grans (Abs): 0 10*3/uL (ref 0.0–0.1)
Immature Granulocytes: 0 %
Lymphocytes Absolute: 3.5 10*3/uL — ABNORMAL HIGH (ref 0.7–3.1)
Lymphs: 28 %
MCH: 31 pg (ref 26.6–33.0)
MCHC: 34.3 g/dL (ref 31.5–35.7)
MCV: 91 fL (ref 79–97)
Monocytes Absolute: 0.6 10*3/uL (ref 0.1–0.9)
Monocytes: 5 %
Neutrophils Absolute: 8.1 10*3/uL — ABNORMAL HIGH (ref 1.4–7.0)
Neutrophils: 63 %
Platelets: 318 10*3/uL (ref 150–450)
RBC: 4.64 x10E6/uL (ref 3.77–5.28)
RDW: 11.9 % (ref 11.7–15.4)
WBC: 12.7 10*3/uL — ABNORMAL HIGH (ref 3.4–10.8)

## 2020-01-09 LAB — CMP14+EGFR
ALT: 33 IU/L — ABNORMAL HIGH (ref 0–32)
AST: 29 IU/L (ref 0–40)
Albumin/Globulin Ratio: 2 (ref 1.2–2.2)
Albumin: 4.8 g/dL (ref 3.8–4.8)
Alkaline Phosphatase: 124 IU/L — ABNORMAL HIGH (ref 44–121)
BUN/Creatinine Ratio: 14 (ref 9–23)
BUN: 12 mg/dL (ref 6–20)
Bilirubin Total: <0.2 mg/dL (ref 0.0–1.2)
CO2: 20 mmol/L (ref 20–29)
Calcium: 10 mg/dL (ref 8.7–10.2)
Chloride: 101 mmol/L (ref 96–106)
Creatinine, Ser: 0.85 mg/dL (ref 0.57–1.00)
GFR calc Af Amer: 102 mL/min/{1.73_m2} (ref >59)
GFR calc non Af Amer: 88 mL/min/{1.73_m2} (ref >59)
Globulin, Total: 2.4 g/dL (ref 1.5–4.5)
Glucose: 93 mg/dL (ref 65–99)
Potassium: 4.2 mmol/L (ref 3.5–5.2)
Sodium: 138 mmol/L (ref 134–144)
Total Protein: 7.2 g/dL (ref 6.0–8.5)

## 2020-01-09 NOTE — Progress Notes (Signed)
Hello [place],  Your lab result is normal and/or stable.Some minor variations that are not significant are commonly marked abnormal, but do not represent any medical problem for you.  Best regards, Mechele Claude, M.D.

## 2020-06-02 ENCOUNTER — Ambulatory Visit
Admission: EM | Admit: 2020-06-02 | Discharge: 2020-06-02 | Disposition: A | Discharge status: Home / Self Care | Payer: Medicaid Other | Attending: Family Medicine | Admitting: Family Medicine

## 2020-06-02 ENCOUNTER — Encounter: Payer: Self-pay | Admitting: Emergency Medicine

## 2020-06-02 ENCOUNTER — Other Ambulatory Visit: Payer: Self-pay

## 2020-06-02 DIAGNOSIS — J069 Acute upper respiratory infection, unspecified: Secondary | ICD-10-CM | POA: Diagnosis not present

## 2020-06-02 DIAGNOSIS — Z20822 Contact with and (suspected) exposure to covid-19: Secondary | ICD-10-CM

## 2020-06-02 DIAGNOSIS — R52 Pain, unspecified: Secondary | ICD-10-CM | POA: Diagnosis not present

## 2020-06-02 DIAGNOSIS — R6883 Chills (without fever): Secondary | ICD-10-CM | POA: Diagnosis not present

## 2020-06-02 DIAGNOSIS — R519 Headache, unspecified: Secondary | ICD-10-CM | POA: Diagnosis not present

## 2020-06-02 DIAGNOSIS — R062 Wheezing: Secondary | ICD-10-CM

## 2020-06-02 MED ORDER — KETOROLAC TROMETHAMINE 30 MG/ML IJ SOLN
30.0000 mg | Freq: Once | INTRAMUSCULAR | Status: AC
  Administered 2020-06-02: 30 mg via INTRAMUSCULAR

## 2020-06-02 MED ORDER — DEXAMETHASONE SODIUM PHOSPHATE 10 MG/ML IJ SOLN
10.0000 mg | Freq: Once | INTRAMUSCULAR | Status: AC
  Administered 2020-06-02: 10 mg via INTRAMUSCULAR

## 2020-06-02 MED ORDER — PROMETHAZINE-DM 6.25-15 MG/5ML PO SYRP: 5.0000 mL | ORAL_SOLUTION | Freq: Four times a day (QID) | ORAL | 0 refills | Status: DC | PRN

## 2020-06-02 MED ORDER — PREDNISONE 10 MG (21) PO TBPK: ORAL_TABLET | Freq: Every day | ORAL | 0 refills | Status: AC

## 2020-06-02 NOTE — ED Provider Notes (Signed)
[facility]   [id number] 06/02/20 Arrival Time: 1539   CC: COVID symptoms  SUBJECTIVE: History from: patient.  Anna Blake is a 35 y.o. female who presents with fever, cough, sore throat chills, body aches, and headache since yesterday. Reports that her child is also sick. Denies sick exposure to COVID, flu or strep. Denies recent travel. Has negative history of Covid. Has not completed Covid vaccines. Has taken ibuprofen for headache this morning with no relief. States that 2 home rapid covid tests were negative. Cough and fatigue are worsened with activity. Denies previous symptoms in the past. Denies sinus pain, rhinorrhea, SOB, wheezing, chest pain, nausea, changes in bowel or bladder habits.    ROS: As per HPI.  All other pertinent ROS negative.     Past Medical History:  Diagnosis Date  . Abnormal vaginal Pap smear   . Anxiety   . Cystic acne   . Depression   . GERD (gastroesophageal reflux disease)    Past Surgical History:  Procedure Laterality Date  . BIOPSY  09/07/2019   Procedure: BIOPSY;  Surgeon: Corbin Ade, MD;  Location: [facility];  Service: Endoscopy;;  . carpel tunnel; release 02/03/19 Dr Amanda Pea  Bilateral   . CESAREAN SECTION    . CHOLECYSTECTOMY    . COLONOSCOPY WITH PROPOFOL N/A 09/07/2019   Procedure: COLONOSCOPY WITH PROPOFOL;  Surgeon: Corbin Ade, MD;  Location: [facility];  Service: Endoscopy;  Laterality: N/A;  1:30pm  . EXPLORATORY LAPAROTOMY     endometriosis  . LAPAROSCOPIC GASTRIC SLEEVE RESECTION  2014  . POLYPECTOMY  09/07/2019   Procedure: POLYPECTOMY;  Surgeon: Corbin Ade, MD;  Location: [facility];  Service: Endoscopy;;  . TONSILLECTOMY     Allergies  Allergen Reactions  . Z-Pak [Azithromycin] Hives   No current facility-administered medications on file prior to encounter.   Current Outpatient Medications on File Prior to Encounter  Medication Sig Dispense Refill  . acetaminophen (TYLENOL) 325 MG tablet  Take 2 tablets (650 mg total) by mouth every 6 (six) hours as needed for mild pain (or temp > 100).    . Biotin 1000 MCG tablet Take 1,000 mcg by mouth daily.     Marland Kitchen buPROPion (WELLBUTRIN XL) 150 MG 24 hr tablet TAKE 2 TABLETS BY MOUTH IN THE MORNING. 180 tablet 1  . dicyclomine (BENTYL) 10 MG capsule Take 1 capsule (10 mg total) by mouth 3 (three) times daily as needed for spasms (bloating, frequent stool). 90 capsule 0  . esomeprazole (NEXIUM) 40 MG capsule Take 1 capsule (40 mg total) by mouth 2 (two) times daily. On an empty stomach 180 capsule 1  . FLUoxetine (PROZAC) 40 MG capsule Take 1 capsule (40 mg total) by mouth daily. 90 capsule 1  . ibuprofen (ADVIL) 200 MG tablet Take 400 mg by mouth daily as needed for headache.     Social History   Socioeconomic History  . Marital status: Single    Spouse name: Not on file  . Number of children: 1  . Years of education: Not on file  . Highest education level: Not on file  Occupational History  . Not on file  Tobacco Use  . Smoking status: Former Games developer  . Smokeless tobacco: Never Used  Vaping Use  . Vaping Use: Never used  Substance and Sexual Activity  . Alcohol use: Not Currently    Comment: social   . Drug use: Never  . Sexual activity: Yes  Partners: Male  Other Topics Concern  . Not on file  Social History Narrative  . Not on file   Social Determinants of Health   Financial Resource Strain: Not on file  Food Insecurity: Not on file  Transportation Needs: Not on file  Physical Activity: Not on file  Stress: Not on file  Social Connections: Not on file  Intimate Partner Violence: Not on file   Family History  Problem Relation Age of Onset  . Congestive Heart Failure Father   . Hyperlipidemia Father   . Hypertension Father   . Colon cancer Father        57  . Heart disease Maternal Grandmother   . Hypertension Maternal Grandmother   . Hyperlipidemia Maternal Grandmother   . Diabetes Maternal Grandmother   .  Cancer Maternal Grandfather        prostate  . Colon cancer Other        great grandfather    OBJECTIVE:  Vitals:   06/02/20 1546  BP: 123/84  Pulse: (!) 122  Resp: 18  Temp: 99.6 F (37.6 C)  TempSrc: Oral  SpO2: 95%     General appearance: alert; appears fatigued, but nontoxic; speaking in full sentences and tolerating own secretions HEENT: NCAT; Ears: EACs clear, TMs pearly gray; Eyes: PERRL.  EOM grossly intact. Sinuses: nontender; Nose: nares patent with clear rhinorrhea, Throat: oropharynx erythematous, cobblestoning present, tonsils non erythematous or enlarged, uvula midline  Neck: supple without LAD Lungs: unlabored respirations, symmetrical air entry; cough: moderate; no respiratory distress; wheezing noted to bilateral lower lobes Heart: regular rate and rhythm.  Radial pulses 2+ symmetrical bilaterally Skin: warm and dry Psychological: alert and cooperative; normal mood and affect  LABS:  No results found for this or any previous visit (from the past 24 hour(s)).   ASSESSMENT & PLAN:  1. Exposure to COVID-19 virus   2. Viral URI with cough   3. Nonintractable headache, unspecified chronicity pattern, unspecified headache type   4. Body aches   5. Chills     Meds ordered this encounter  Medications  . ketorolac (TORADOL) 30 MG/ML injection 30 mg  . dexamethasone (DECADRON) injection 10 mg  . promethazine-dextromethorphan (PROMETHAZINE-DM) 6.25-15 MG/5ML syrup    Sig: Take 5 mLs by mouth 4 (four) times daily as needed for cough.    Dispense:  118 mL    Refill:  0    Order Specific Question:   Supervising Provider    Answer:   Merrilee Jansky [id number]  . predniSONE (STERAPRED UNI-PAK 21 TAB) 10 MG (21) TBPK tablet    Sig: Take by mouth daily for 6 days. Take 6 tablets on day 1, 5 tablets on day 2, 4 tablets on day 3, 3 tablets on day 4, 2 tablets on day 5, 1 tablet on day 6    Dispense:  21 tablet    Refill:  0    Order Specific Question:    Supervising Provider    Answer:   Merrilee Jansky [[id number]]    Toradol 30mg  IM in office today Decadron 10mg  IM in office today Steroids prescribed Promethazine cough syrup prescribed Sedation precautions given Continue supportive care at home COVID and flu testing ordered.  It will take between 2-3 days for test results. Someone will contact you regarding abnormal results.   Work note provided Patient should remain in quarantine until they have received Covid results.  If negative you may resume normal activities (go back to work/school) while practicing  hand hygiene, social distance, and mask wearing.  If positive, patient should remain in quarantine for at least 5 days from symptom onset AND greater than 72 hours after symptoms resolution (absence of fever without the use of fever-reducing medication and improvement in respiratory symptoms), whichever is longer Get plenty of rest and push fluids Use OTC zyrtec for nasal congestion, runny nose, and/or sore throat Use OTC flonase for nasal congestion and runny nose Use medications daily for symptom relief Use OTC medications like ibuprofen or tylenol as needed fever or pain Call or go to the ED if you have any new or worsening symptoms such as fever, worsening cough, shortness of breath, chest tightness, chest pain, turning blue, changes in mental status.  Reviewed expectations re: course of current medical issues. Questions answered. Outlined signs and symptoms indicating need for more acute intervention. Patient verbalized understanding. After Visit Summary given.         Moshe Cipro, NP 06/02/20 1559

## 2020-06-02 NOTE — ED Triage Notes (Signed)
Sore throat, headache, fever, chills, body aches and cough since saturday

## 2020-06-02 NOTE — Discharge Instructions (Addendum)
Your COVID test is pending.  You should self quarantine until the test result is back.    You have received toradol and decadron in the office today  I have sent in a prednisone taper for you to take for 6 days. 6 tablets on day one, 5 tablets on day two, 4 tablets on day three, 3 tablets on day four, 2 tablets on day five, and 1 tablet on day six.  I have sent in cough syrup for you to take. This medication can make you sleepy. Do not drive while taking this medication.  Take Tylenol or ibuprofen as needed for fever or discomfort.  Rest and keep yourself hydrated.    Follow-up with your primary care provider if your symptoms are not improving.

## 2020-06-04 LAB — COVID-19, FLU A+B NAA
Influenza A, NAA: DETECTED — AB
Influenza B, NAA: NOT DETECTED
SARS-CoV-2, NAA: NOT DETECTED

## 2020-06-05 ENCOUNTER — Other Ambulatory Visit: Payer: Self-pay | Admitting: Adult Health

## 2020-06-19 ENCOUNTER — Ambulatory Visit
Admission: EM | Admit: 2020-06-19 | Discharge: 2020-06-19 | Disposition: A | Discharge status: Home / Self Care | Payer: Medicaid Other | Attending: Physician Assistant | Admitting: Physician Assistant

## 2020-06-19 ENCOUNTER — Encounter: Payer: Self-pay | Admitting: Emergency Medicine

## 2020-06-19 DIAGNOSIS — B009 Herpesviral infection, unspecified: Secondary | ICD-10-CM

## 2020-06-19 MED ORDER — ACYCLOVIR 200 MG PO CAPS: 200.0000 mg | ORAL_CAPSULE | Freq: Every day | ORAL | 0 refills | Status: AC

## 2020-06-19 NOTE — ED Triage Notes (Signed)
Fever blister on RT nose and lip and swollen lymph node on neck that started yesterday.

## 2020-06-20 NOTE — ED Provider Notes (Signed)
[facility]    CSN: [id number] Arrival date & time: 06/19/20  1337      History   Chief Complaint No chief complaint on file.   HPI Anna Blake is a 35 y.o. female.   The history is provided by the patient. No language interpreter was used.  Mouth Lesions Location:  Upper lip (nasal area) Upper lip location:  R inner Onset quality:  Gradual Chronicity:  New Context: stress   Relieved by:  Nothing Ineffective treatments:  None tried Pt reports swelling of right lymph nodes   Past Medical History:  Diagnosis Date  . Abnormal vaginal Pap smear   . Anxiety   . Cystic acne   . Depression   . GERD (gastroesophageal reflux disease)     Patient Active Problem List   Diagnosis Date Noted  . Abnormal CT scan, colon 07/18/2019  . Leukocytosis 07/18/2019  . Nausea with vomiting 06/04/2019  . Diarrhea 06/04/2019  . Irritable bowel syndrome with predominant constipation 06/04/2019  . Gastroenteritis 06/03/2019  . Mixed hyperlipidemia 05/25/2019  . GAD (generalized anxiety disorder) 03/15/2019  . Depression, recurrent (HCC) 03/15/2019  . Cystic acne vulgaris 03/15/2019  . BMI 36.0-36.9,adult 03/15/2019  . IUD (intrauterine device) in place 10/26/2018  . Former tobacco use 10/26/2018  . History of cholecystectomy 10/26/2018  . History of tonsillectomy 10/26/2018  . History of bariatric surgery 10/26/2018  . History of abnormal cervical Pap smear 10/26/2018  . Anxiety and depression 10/26/2018  . Gastroesophageal reflux disease without esophagitis 10/26/2018  . Abnormal weight gain 10/26/2018    Past Surgical History:  Procedure Laterality Date  . BIOPSY  09/07/2019   Procedure: BIOPSY;  Surgeon: Corbin Ade, MD;  Location: [facility];  Service: Endoscopy;;  . carpel tunnel; release 02/03/19 Dr Amanda Pea  Bilateral   . CESAREAN SECTION    . CHOLECYSTECTOMY    . COLONOSCOPY WITH PROPOFOL N/A 09/07/2019   Procedure: COLONOSCOPY WITH PROPOFOL;  Surgeon:  Corbin Ade, MD;  Location: [facility];  Service: Endoscopy;  Laterality: N/A;  1:30pm  . EXPLORATORY LAPAROTOMY     endometriosis  . LAPAROSCOPIC GASTRIC SLEEVE RESECTION  2014  . POLYPECTOMY  09/07/2019   Procedure: POLYPECTOMY;  Surgeon: Corbin Ade, MD;  Location: [facility];  Service: Endoscopy;;  . TONSILLECTOMY      OB History   No obstetric history on file.      Home Medications    Prior to Admission medications   Medication Sig Start Date End Date Taking? Authorizing Provider  acyclovir (ZOVIRAX) 200 MG capsule Take 1 capsule (200 mg total) by mouth 5 (five) times daily for 10 days. 06/19/20 06/29/20 Yes Elson Areas, PA-C  acetaminophen (TYLENOL) 325 MG tablet Take 2 tablets (650 mg total) by mouth every 6 (six) hours as needed for mild pain (or temp > 100). 06/05/19   Juliet Rude, PA-C  Biotin 1000 MCG tablet Take 1,000 mcg by mouth daily.     [provider]  buPROPion (WELLBUTRIN XL) 150 MG 24 hr tablet TAKE 2 TABLETS BY MOUTH IN THE MORNING. 01/08/20   Mechele Claude, MD  dicyclomine (BENTYL) 10 MG capsule Take 1 capsule (10 mg total) by mouth 3 (three) times daily as needed for spasms (bloating, frequent stool). 07/27/19   Tiffany Kocher, PA-C  esomeprazole (NEXIUM) 40 MG capsule Take 1 capsule (40 mg total) by mouth 2 (two) times daily. On an empty stomach 01/08/20   Mechele Claude,  MD  FLUoxetine (PROZAC) 40 MG capsule Take 1 capsule (40 mg total) by mouth daily. 01/08/20 02/07/20  Mechele Claude, MD  ibuprofen (ADVIL) 200 MG tablet Take 400 mg by mouth daily as needed for headache.    [provider]  promethazine-dextromethorphan (PROMETHAZINE-DM) 6.25-15 MG/5ML syrup Take 5 mLs by mouth 4 (four) times daily as needed for cough. 06/02/20   Moshe Cipro, NP    Family History Family History  Problem Relation Age of Onset  . Congestive Heart Failure Father   . Hyperlipidemia Father   . Hypertension Father   . Colon cancer Father         71  . Heart disease Maternal Grandmother   . Hypertension Maternal Grandmother   . Hyperlipidemia Maternal Grandmother   . Diabetes Maternal Grandmother   . Cancer Maternal Grandfather        prostate  . Colon cancer Other        great grandfather    Social History Social History   Tobacco Use  . Smoking status: Former Games developer  . Smokeless tobacco: Never Used  Vaping Use  . Vaping Use: Never used  Substance Use Topics  . Alcohol use: Not Currently    Comment: social   . Drug use: Never     Allergies   Z-pak [azithromycin]   Review of Systems Review of Systems  HENT: Positive for mouth sores.   All other systems reviewed and are negative.    Physical Exam Triage Vital Signs ED Triage Vitals  Enc Vitals Group     BP 06/19/20 1434 128/84     Pulse Rate 06/19/20 1434 100     Resp 06/19/20 1434 16     Temp 06/19/20 1434 97.6 F (36.4 C)     Temp Source 06/19/20 1434 Tympanic     SpO2 06/19/20 1434 96 %     Weight --      Height --      Head Circumference --      Peak Flow --      Pain Score 06/19/20 1438 2     Pain Loc --      Pain Edu? --      Excl. in GC? --    No data found.  Updated Vital Signs BP 128/84 (BP Location: Right Arm)   Pulse 100   Temp 97.6 F (36.4 C) (Tympanic)   Resp 16   SpO2 96%   Visual Acuity Right Eye Distance:   Left Eye Distance:   Bilateral Distance:    Right Eye Near:   Left Eye Near:    Bilateral Near:     Physical Exam Vitals and nursing note reviewed.  Constitutional:      Appearance: She is well-developed and well-nourished.  HENT:     Head: Normocephalic.     Nose:     Comments: Cluster of blisters right nare     Mouth/Throat:     Comments: Oral ulcer Eyes:     Extraocular Movements: EOM normal.  Cardiovascular:     Rate and Rhythm: Normal rate.  Pulmonary:     Effort: Pulmonary effort is normal.  Abdominal:     General: There is no distension.  Musculoskeletal:        General: Normal  range of motion.     Cervical back: Normal range of motion.  Lymphadenopathy:     Cervical: Cervical adenopathy present.  Skin:    General: Skin is warm.  Neurological:  Mental Status: She is alert and oriented to person, place, and time.  Psychiatric:        Mood and Affect: Mood and affect and mood normal.      UC Treatments / Results  Labs (all labs ordered are listed, but only abnormal results are displayed) Labs Reviewed - No data to display  EKG   Radiology No results found.  Procedures Procedures (including critical care time)  Medications Ordered in UC Medications - No data to display  Initial Impression / Assessment and Plan / UC Course  I have reviewed the triage vital signs and the nursing notes.  Pertinent labs & imaging results that were available during my care of the patient were reviewed by me and considered in my medical decision making (see chart for details).     MDM:  Pt given rx for acyclovir.  Pt advised lymph nodes should resolve with infection. Final Clinical Impressions(s) / UC Diagnoses   Final diagnoses:  Herpes simplex   Discharge Instructions   None    ED Prescriptions    Medication Sig Dispense Auth. Provider   acyclovir (ZOVIRAX) 200 MG capsule Take 1 capsule (200 mg total) by mouth 5 (five) times daily for 10 days. 25 capsule Elson Areas, [place]     PDMP not reviewed this encounter.  An After Visit Summary was printed and given to the patient.    Elson Areas, [place] 06/20/20 [phone]

## 2020-07-08 ENCOUNTER — Ambulatory Visit: Payer: Medicaid Other | Admitting: Family Medicine

## 2020-07-16 DIAGNOSIS — K581 Irritable bowel syndrome with constipation: Secondary | ICD-10-CM | POA: Diagnosis not present

## 2020-07-16 DIAGNOSIS — D72829 Elevated white blood cell count, unspecified: Secondary | ICD-10-CM | POA: Diagnosis not present

## 2020-07-16 DIAGNOSIS — K219 Gastro-esophageal reflux disease without esophagitis: Secondary | ICD-10-CM | POA: Diagnosis not present

## 2020-07-21 ENCOUNTER — Ambulatory Visit
Admission: EM | Admit: 2020-07-21 | Discharge: 2020-07-21 | Disposition: A | Discharge status: Home / Self Care | Payer: Medicaid Other | Attending: Physician Assistant | Admitting: Physician Assistant

## 2020-07-21 ENCOUNTER — Other Ambulatory Visit: Payer: Self-pay

## 2020-07-21 ENCOUNTER — Encounter: Payer: Self-pay | Admitting: Emergency Medicine

## 2020-07-21 ENCOUNTER — Ambulatory Visit (INDEPENDENT_AMBULATORY_CARE_PROVIDER_SITE_OTHER): Payer: Medicaid Other

## 2020-07-21 DIAGNOSIS — M79672 Pain in left foot: Secondary | ICD-10-CM

## 2020-07-21 DIAGNOSIS — M7989 Other specified soft tissue disorders: Secondary | ICD-10-CM | POA: Diagnosis not present

## 2020-07-21 DIAGNOSIS — M722 Plantar fascial fibromatosis: Secondary | ICD-10-CM

## 2020-07-21 NOTE — Discharge Instructions (Addendum)
Apply ice to affected area Recommend stretching as outlined daily Recommend ibuprofen as needed.  Recommend night splints, see attached description

## 2020-07-21 NOTE — ED Triage Notes (Addendum)
Knot on bottom of LT foot that is more painful with walking x 2 weeks. Denies any injury.  Pt has tried using massage, heat, ice and ibuproferon with no relief.

## 2020-07-21 NOTE — ED Provider Notes (Signed)
[facility]    CSN: [id number] Arrival date & time: 07/21/20  1251      History   Chief Complaint Chief Complaint  Patient presents with  . Foot Pain    HPI Anna Blake is a 35 y.o. female.   Pt complains of left heel and mid foot pain that started about two weeks ago.  Denies injury or trauma.  Reports pain is worse when walking, weight bearing.  She initially thought it was plantar fasciitis and froze a water bottle to apply ice.  She has had minimal improvement.  She is worried there is a more serious injury due to lack of improvement.       Past Medical History:  Diagnosis Date  . Abnormal vaginal Pap smear   . Anxiety   . Cystic acne   . Depression   . GERD (gastroesophageal reflux disease)     Patient Active Problem List   Diagnosis Date Noted  . Abnormal CT scan, colon 07/18/2019  . Leukocytosis 07/18/2019  . Nausea with vomiting 06/04/2019  . Diarrhea 06/04/2019  . Irritable bowel syndrome with predominant constipation 06/04/2019  . Gastroenteritis 06/03/2019  . Mixed hyperlipidemia 05/25/2019  . GAD (generalized anxiety disorder) 03/15/2019  . Depression, recurrent (HCC) 03/15/2019  . Cystic acne vulgaris 03/15/2019  . BMI 36.0-36.9,adult 03/15/2019  . IUD (intrauterine device) in place 10/26/2018  . Former tobacco use 10/26/2018  . History of cholecystectomy 10/26/2018  . History of tonsillectomy 10/26/2018  . History of bariatric surgery 10/26/2018  . History of abnormal cervical Pap smear 10/26/2018  . Anxiety and depression 10/26/2018  . Gastroesophageal reflux disease without esophagitis 10/26/2018  . Abnormal weight gain 10/26/2018    Past Surgical History:  Procedure Laterality Date  . BIOPSY  09/07/2019   Procedure: BIOPSY;  Surgeon: Corbin Ade, MD;  Location: [facility];  Service: Endoscopy;;  . carpel tunnel; release 02/03/19 Dr Amanda Pea  Bilateral   . CESAREAN SECTION    . CHOLECYSTECTOMY    . COLONOSCOPY WITH  PROPOFOL N/A 09/07/2019   Procedure: COLONOSCOPY WITH PROPOFOL;  Surgeon: Corbin Ade, MD;  Location: [facility];  Service: Endoscopy;  Laterality: N/A;  1:30pm  . EXPLORATORY LAPAROTOMY     endometriosis  . LAPAROSCOPIC GASTRIC SLEEVE RESECTION  2014  . POLYPECTOMY  09/07/2019   Procedure: POLYPECTOMY;  Surgeon: Corbin Ade, MD;  Location: [facility];  Service: Endoscopy;;  . TONSILLECTOMY      OB History   No obstetric history on file.      Home Medications    Prior to Admission medications   Medication Sig Start Date End Date Taking? Authorizing Provider  acetaminophen (TYLENOL) 325 MG tablet Take 2 tablets (650 mg total) by mouth every 6 (six) hours as needed for mild pain (or temp > 100). 06/05/19   Juliet Rude, PA-C  Biotin 1000 MCG tablet Take 1,000 mcg by mouth daily.     [provider]  buPROPion (WELLBUTRIN XL) 150 MG 24 hr tablet TAKE 2 TABLETS BY MOUTH IN THE MORNING. 01/08/20   Mechele Claude, MD  dicyclomine (BENTYL) 10 MG capsule Take 1 capsule (10 mg total) by mouth 3 (three) times daily as needed for spasms (bloating, frequent stool). 07/27/19   Tiffany Kocher, PA-C  esomeprazole (NEXIUM) 40 MG capsule Take 1 capsule (40 mg total) by mouth 2 (two) times daily. On an empty stomach 01/08/20   Mechele Claude, MD  FLUoxetine (PROZAC) 40 MG  capsule Take 1 capsule (40 mg total) by mouth daily. 01/08/20 02/07/20  Mechele Claude, MD  ibuprofen (ADVIL) 200 MG tablet Take 400 mg by mouth daily as needed for headache.    [provider]  promethazine-dextromethorphan (PROMETHAZINE-DM) 6.25-15 MG/5ML syrup Take 5 mLs by mouth 4 (four) times daily as needed for cough. 06/02/20   Moshe Cipro, NP    Family History Family History  Problem Relation Age of Onset  . Congestive Heart Failure Father   . Hyperlipidemia Father   . Hypertension Father   . Colon cancer Father        98  . Heart disease Maternal Grandmother   . Hypertension Maternal  Grandmother   . Hyperlipidemia Maternal Grandmother   . Diabetes Maternal Grandmother   . Cancer Maternal Grandfather        prostate  . Colon cancer Other        great grandfather    Social History Social History   Tobacco Use  . Smoking status: Former Games developer  . Smokeless tobacco: Never Used  Vaping Use  . Vaping Use: Never used  Substance Use Topics  . Alcohol use: Not Currently    Comment: social   . Drug use: Never     Allergies   Z-pak [azithromycin]   Review of Systems Review of Systems  Constitutional: Negative for chills and fever.  HENT: Negative for ear pain and sore throat.   Eyes: Negative for pain and visual disturbance.  Respiratory: Negative for cough and shortness of breath.   Cardiovascular: Negative for chest pain and palpitations.  Gastrointestinal: Negative for abdominal pain and vomiting.  Genitourinary: Negative for dysuria and hematuria.  Musculoskeletal: Negative for arthralgias and back pain.       Left foot heel/mid foot pain   Skin: Negative for color change and rash.  Neurological: Negative for seizures and syncope.  All other systems reviewed and are negative.    Physical Exam Triage Vital Signs ED Triage Vitals  Enc Vitals Group     BP 07/21/20 1259 124/86     Pulse Rate 07/21/20 1259 98     Resp 07/21/20 1259 17     Temp 07/21/20 1259 98.3 F (36.8 C)     Temp Source 07/21/20 1259 Oral     SpO2 07/21/20 1259 98 %     Weight --      Height --      Head Circumference --      Peak Flow --      Pain Score 07/21/20 1257 2     Pain Loc --      Pain Edu? --      Excl. in GC? --    No data found.  Updated Vital Signs BP 124/86 (BP Location: Right Arm)   Pulse 98   Temp 98.3 F (36.8 C) (Oral)   Resp 17   SpO2 98%   Visual Acuity Right Eye Distance:   Left Eye Distance:   Bilateral Distance:    Right Eye Near:   Left Eye Near:    Bilateral Near:     Physical Exam Vitals and nursing note reviewed.   Constitutional:      General: She is not in acute distress.    Appearance: She is well-developed.  HENT:     Head: Normocephalic and atraumatic.  Eyes:     Conjunctiva/sclera: Conjunctivae normal.  Cardiovascular:     Rate and Rhythm: Normal rate and regular rhythm.  Heart sounds: No murmur heard.   Pulmonary:     Effort: Pulmonary effort is normal. No respiratory distress.     Breath sounds: Normal breath sounds.  Abdominal:     Palpations: Abdomen is soft.     Tenderness: There is no abdominal tenderness.  Musculoskeletal:     Cervical back: Neck supple.     Left foot: Normal range of motion.       Feet:  Feet:     Left foot:     Skin integrity: No erythema.     Toenail Condition: Left toenails are normal.  Skin:    General: Skin is warm and dry.  Neurological:     Mental Status: She is alert.      UC Treatments / Results  Labs (all labs ordered are listed, but only abnormal results are displayed) Labs Reviewed - No data to display  EKG   Radiology DG Foot 2 Views Left  Result Date: 07/21/2020 CLINICAL DATA:  Focal thickening along the heel for 2 weeks. EXAM: LEFT FOOT - 2 VIEW COMPARISON:  06/11/2018 FINDINGS: There is no evidence of fracture or dislocation. There is no evidence of arthropathy or other focal bone abnormality. Soft tissues are grossly unremarkable. IMPRESSION: Negative.  Recommend clinical follow-up as indicated. Electronically Signed   By: Harmon Pier M.D.   On: 07/21/2020 13:18    Procedures Procedures (including critical care time)  Medications Ordered in UC Medications - No data to display  Initial Impression / Assessment and Plan / UC Course  I have reviewed the triage vital signs and the nursing notes.  Pertinent labs & imaging results that were available during my care of the patient were reviewed by me and considered in my medical decision making (see chart for details).     Plantar fasciitis. Negative images.  Recommend  she continue with ice. Stretches given today as well.  Discussed night splints, information given.  She will continue with conservative treatment.  F/u with ortho or pcp if no improvement.  Final Clinical Impressions(s) / UC Diagnoses   Final diagnoses:  Plantar fasciitis, left     Discharge Instructions     Apply ice to affected area Recommend stretching as outlined daily Recommend ibuprofen as needed.  Recommend night splints, see attached description   ED Prescriptions    None     PDMP not reviewed this encounter.   Jodell Cipro, PA-C 07/21/20 1355

## 2020-07-29 DIAGNOSIS — Z975 Presence of (intrauterine) contraceptive device: Secondary | ICD-10-CM | POA: Diagnosis not present

## 2020-07-29 DIAGNOSIS — M722 Plantar fascial fibromatosis: Secondary | ICD-10-CM | POA: Diagnosis not present

## 2020-07-29 DIAGNOSIS — N809 Endometriosis, unspecified: Secondary | ICD-10-CM | POA: Diagnosis not present

## 2020-07-29 DIAGNOSIS — Z0000 Encounter for general adult medical examination without abnormal findings: Secondary | ICD-10-CM | POA: Diagnosis not present

## 2020-07-29 DIAGNOSIS — R635 Abnormal weight gain: Secondary | ICD-10-CM | POA: Diagnosis not present

## 2020-07-29 DIAGNOSIS — Z124 Encounter for screening for malignant neoplasm of cervix: Secondary | ICD-10-CM | POA: Diagnosis not present

## 2020-07-29 DIAGNOSIS — Z309 Encounter for contraceptive management, unspecified: Secondary | ICD-10-CM | POA: Diagnosis not present

## 2020-07-29 DIAGNOSIS — Z3009 Encounter for other general counseling and advice on contraception: Secondary | ICD-10-CM | POA: Diagnosis not present

## 2020-07-29 DIAGNOSIS — Z903 Acquired absence of stomach [part of]: Secondary | ICD-10-CM | POA: Diagnosis not present

## 2020-09-04 ENCOUNTER — Other Ambulatory Visit: Payer: Self-pay | Admitting: Family Medicine

## 2020-09-11 ENCOUNTER — Other Ambulatory Visit: Payer: Self-pay

## 2020-09-11 ENCOUNTER — Ambulatory Visit
Admission: EM | Admit: 2020-09-11 | Discharge: 2020-09-11 | Disposition: A | Discharge status: Home / Self Care | Payer: Medicaid Other | Attending: Family Medicine | Admitting: Family Medicine

## 2020-09-11 ENCOUNTER — Encounter: Payer: Self-pay | Admitting: Emergency Medicine

## 2020-09-11 DIAGNOSIS — B349 Viral infection, unspecified: Secondary | ICD-10-CM | POA: Diagnosis not present

## 2020-09-11 DIAGNOSIS — R52 Pain, unspecified: Secondary | ICD-10-CM | POA: Diagnosis not present

## 2020-09-11 DIAGNOSIS — R519 Headache, unspecified: Secondary | ICD-10-CM

## 2020-09-11 MED ORDER — DEXAMETHASONE SODIUM PHOSPHATE 10 MG/ML IJ SOLN
10.0000 mg | Freq: Once | INTRAMUSCULAR | Status: AC
  Administered 2020-09-11: 10 mg via INTRAMUSCULAR

## 2020-09-11 MED ORDER — KETOROLAC TROMETHAMINE 60 MG/2ML IM SOLN
30.0000 mg | Freq: Once | INTRAMUSCULAR | Status: AC
  Administered 2020-09-11: 30 mg via INTRAMUSCULAR

## 2020-09-11 NOTE — ED Triage Notes (Signed)
Headache, body aches and chills that started today.  Person in the household is covid +

## 2020-09-11 NOTE — ED Provider Notes (Signed)
[facility]    CSN: [id number] Arrival date & time: 09/11/20  1808      History   Chief Complaint No chief complaint on file.   HPI Anna Blake is a 35 y.o. female.   Reports that she has been having headache, body aches and chills that started this morning.  States that her significant other who lives in the household with her is COVID-positive.  She has negative history of COVID.  Has completed COVID vaccines, no boosters.  Has completed flu vaccine.  Has taken ibuprofen and Tylenol at home with some mild relief of aching.  Denies abdominal pain, nausea, vomiting, diarrhea, rash, fever, other symptoms.  ROS per HPI   The history is provided by the patient.    Past Medical History:  Diagnosis Date  . Abnormal vaginal Pap smear   . Anxiety   . Cystic acne   . Depression   . GERD (gastroesophageal reflux disease)     Patient Active Problem List   Diagnosis Date Noted  . Abnormal CT scan, colon 07/18/2019  . Leukocytosis 07/18/2019  . Nausea with vomiting 06/04/2019  . Diarrhea 06/04/2019  . Irritable bowel syndrome with predominant constipation 06/04/2019  . Gastroenteritis 06/03/2019  . Mixed hyperlipidemia 05/25/2019  . GAD (generalized anxiety disorder) 03/15/2019  . Depression, recurrent (HCC) 03/15/2019  . Cystic acne vulgaris 03/15/2019  . BMI 36.0-36.9,adult 03/15/2019  . IUD (intrauterine device) in place 10/26/2018  . Former tobacco use 10/26/2018  . History of cholecystectomy 10/26/2018  . History of tonsillectomy 10/26/2018  . History of bariatric surgery 10/26/2018  . History of abnormal cervical Pap smear 10/26/2018  . Anxiety and depression 10/26/2018  . Gastroesophageal reflux disease without esophagitis 10/26/2018  . Abnormal weight gain 10/26/2018    Past Surgical History:  Procedure Laterality Date  . BIOPSY  09/07/2019   Procedure: BIOPSY;  Surgeon: Corbin Ade, MD;  Location: [facility];  Service: Endoscopy;;  .  carpel tunnel; release 02/03/19 Dr Amanda Pea  Bilateral   . CESAREAN SECTION    . CHOLECYSTECTOMY    . COLONOSCOPY WITH PROPOFOL N/A 09/07/2019   Procedure: COLONOSCOPY WITH PROPOFOL;  Surgeon: Corbin Ade, MD;  Location: [facility];  Service: Endoscopy;  Laterality: N/A;  1:30pm  . EXPLORATORY LAPAROTOMY     endometriosis  . LAPAROSCOPIC GASTRIC SLEEVE RESECTION  2014  . POLYPECTOMY  09/07/2019   Procedure: POLYPECTOMY;  Surgeon: Corbin Ade, MD;  Location: [facility];  Service: Endoscopy;;  . TONSILLECTOMY      OB History   No obstetric history on file.      Home Medications    Prior to Admission medications   Medication Sig Start Date End Date Taking? Authorizing Provider  acetaminophen (TYLENOL) 325 MG tablet Take 2 tablets (650 mg total) by mouth every 6 (six) hours as needed for mild pain (or temp > 100). 06/05/19   Juliet Rude, PA-C  Biotin 1000 MCG tablet Take 1,000 mcg by mouth daily.     [provider]  buPROPion (WELLBUTRIN XL) 150 MG 24 hr tablet TAKE 2 TABLETS BY MOUTH IN THE MORNING. 01/08/20   Mechele Claude, MD  dicyclomine (BENTYL) 10 MG capsule Take 1 capsule (10 mg total) by mouth 3 (three) times daily as needed for spasms (bloating, frequent stool). 07/27/19   Tiffany Kocher, PA-C  esomeprazole (NEXIUM) 40 MG capsule Take 1 capsule (40 mg total) by mouth 2 (two) times daily. On an empty  stomach (NEEDS TO BE SEEN BEFORE NEXT REFILL) 09/04/20   Mechele ClaudeStacks, Warren, MD  FLUoxetine (PROZAC) 40 MG capsule Take 1 capsule (40 mg total) by mouth daily. 01/08/20 02/07/20  Mechele ClaudeStacks, Warren, MD  ibuprofen (ADVIL) 200 MG tablet Take 400 mg by mouth daily as needed for headache.    [provider]  molnupiravir EUA 200 mg CAPS Take 4 capsules (800 mg total) by mouth 2 (two) times daily for 5 days. 09/13/20 09/18/20  Loa Socksausey, Lindsey Cornetto, NP  promethazine-dextromethorphan (PROMETHAZINE-DM) 6.25-15 MG/5ML syrup Take 5 mLs by mouth 4 (four) times daily as  needed for cough. 06/02/20   Moshe CiproMatthews, Kingsten Enfield, NP    Family History Family History  Problem Relation Age of Onset  . Congestive Heart Failure Father   . Hyperlipidemia Father   . Hypertension Father   . Colon cancer Father        8075  . Heart disease Maternal Grandmother   . Hypertension Maternal Grandmother   . Hyperlipidemia Maternal Grandmother   . Diabetes Maternal Grandmother   . Cancer Maternal Grandfather        prostate  . Colon cancer Other        great grandfather    Social History Social History   Tobacco Use  . Smoking status: Former Games developermoker  . Smokeless tobacco: Never Used  Vaping Use  . Vaping Use: Never used  Substance Use Topics  . Alcohol use: Not Currently    Comment: social   . Drug use: Never     Allergies   Z-pak [azithromycin]   Review of Systems Review of Systems   Physical Exam Triage Vital Signs ED Triage Vitals  Enc Vitals Group     BP 09/11/20 1817 130/82     Pulse Rate 09/11/20 1817 (!) 118     Resp 09/11/20 1817 16     Temp 09/11/20 1817 99.7 F (37.6 C)     Temp Source 09/11/20 1817 Oral     SpO2 09/11/20 1817 94 %     Weight --      Height --      Head Circumference --      Peak Flow --      Pain Score 09/11/20 1821 7     Pain Loc --      Pain Edu? --      Excl. in GC? --    No data found.  Updated Vital Signs BP 130/82 (BP Location: Right Arm)   Pulse (!) 118   Temp 99.7 F (37.6 C) (Oral)   Resp 16   SpO2 94%   Visual Acuity Right Eye Distance:   Left Eye Distance:   Bilateral Distance:    Right Eye Near:   Left Eye Near:    Bilateral Near:     Physical Exam Vitals and nursing note reviewed.  Constitutional:      General: She is not in acute distress.    Appearance: She is well-developed. She is ill-appearing.  HENT:     Head: Normocephalic and atraumatic.     Right Ear: Tympanic membrane, ear canal and external ear normal.     Left Ear: Tympanic membrane, ear canal and external ear normal.      Nose: Congestion present.     Mouth/Throat:     Mouth: Mucous membranes are moist.     Pharynx: Posterior oropharyngeal erythema present.  Eyes:     Extraocular Movements: Extraocular movements intact.     Conjunctiva/sclera: Conjunctivae normal.  Pupils: Pupils are equal, round, and reactive to light.  Cardiovascular:     Rate and Rhythm: Normal rate and regular rhythm.     Heart sounds: Normal heart sounds. No murmur heard.   Pulmonary:     Effort: Pulmonary effort is normal. No respiratory distress.     Breath sounds: Normal breath sounds. No stridor. No wheezing, rhonchi or rales.     Comments: Moderate dry cough in office today Chest:     Chest wall: No tenderness.  Abdominal:     Palpations: Abdomen is soft.     Tenderness: There is no abdominal tenderness.  Musculoskeletal:        General: Normal range of motion.     Cervical back: Normal range of motion and neck supple.  Lymphadenopathy:     Cervical: No cervical adenopathy.  Skin:    General: Skin is warm and dry.     Capillary Refill: Capillary refill takes less than 2 seconds.  Neurological:     General: No focal deficit present.     Mental Status: She is alert and oriented to person, place, and time.  Psychiatric:        Mood and Affect: Mood normal.        Thought Content: Thought content normal.      UC Treatments / Results  Labs (all labs ordered are listed, but only abnormal results are displayed) Labs Reviewed  NOVEL CORONAVIRUS, NAA - Abnormal; Notable for the following components:      Result Value   SARS-CoV-2, NAA Detected (*)    All other components within normal limits   Narrative:    Performed at:  [address] [place], [place]  [id number] Lab Director: Jolene Schimke MD, Phone:  [phone]  SARS-COV-2, NAA 2 DAY TAT   Narrative:    Performed at:  [address] [place] [address] [place], [place]  [id number] Lab Director: Jolene Schimke MD, Phone:   [phone]    EKG   Radiology No results found.  Procedures Procedures (including critical care time)  Medications Ordered in UC Medications  ketorolac (TORADOL) injection 30 mg (30 mg Intramuscular Given 09/11/20 1856)  dexamethasone (DECADRON) injection 10 mg (10 mg Intramuscular Given 09/11/20 1856)    Initial Impression / Assessment and Plan / UC Course  I have reviewed the triage vital signs and the nursing notes.  Pertinent labs & imaging results that were available during my care of the patient were reviewed by me and considered in my medical decision making (see chart for details).    Viral illness Headaches Body aches  Toradol 30 mg IM given in office today Decadron 10 mg IM given in office today Work note provided Covid and flu swab obtained in office today.   Patient instructed to quarantine until results are back and negative.   If results are negative, patient may resume daily schedule as tolerated once they are fever free for 24 hours without the use of antipyretic medications.   If results are positive, patient instructed to quarantine for at least 5 days from symptom onset.  If after 5 days symptoms have resolved, may return to work with a well fitting mask for the next 5 days. If symptomatic after day 5, isolation should be extended to 10 days. Patient instructed to follow-up with primary care or with this office as needed.   Patient instructed to follow-up in the ER for trouble swallowing, trouble breathing, other concerning symptoms.  Final Clinical Impressions(s) / UC Diagnoses   Final diagnoses:  Body aches  Viral illness  Nonintractable headache, unspecified chronicity pattern, unspecified headache type     Discharge Instructions     You have received toradol and decadron in the office for a headache  Your COVID and Influenza tests are pending. You should self quarantine until the test results are back.    Take Tylenol or ibuprofen as  needed for fever or discomfort. Rest and keep yourself hydrated.    Follow-up with your primary care provider if your symptoms are not improving.        ED Prescriptions    None     PDMP not reviewed this encounter.   Moshe Cipro, NP 09/17/20 1526

## 2020-09-11 NOTE — Discharge Instructions (Signed)
You have received toradol and decadron in the office for a headache  Your COVID and Influenza tests are pending. You should self quarantine until the test results are back.    Take Tylenol or ibuprofen as needed for fever or discomfort. Rest and keep yourself hydrated.    Follow-up with your primary care provider if your symptoms are not improving.

## 2020-09-12 LAB — SARS-COV-2, NAA 2 DAY TAT

## 2020-09-12 LAB — NOVEL CORONAVIRUS, NAA: SARS-CoV-2, NAA: DETECTED — AB

## 2020-09-13 ENCOUNTER — Encounter: Payer: Self-pay | Admitting: Adult Health

## 2020-09-13 ENCOUNTER — Other Ambulatory Visit: Payer: Self-pay | Admitting: Adult Health

## 2020-09-13 DIAGNOSIS — U071 COVID-19: Secondary | ICD-10-CM

## 2020-09-13 MED ORDER — MOLNUPIRAVIR EUA 200MG CAPSULE: 4.0000 | ORAL_CAPSULE | Freq: Two times a day (BID) | ORAL | 0 refills | Status: AC

## 2020-09-13 NOTE — Progress Notes (Signed)
Outpatient Oral COVID Treatment Note  I connected with Duwayne Heck on 09/13/2020/12:27 PM by telephone and verified that I am speaking with the correct person using two identifiers.  I discussed the limitations, risks, security, and privacy concerns of performing an evaluation and management service by telephone and the availability of in person appointments. I also discussed with the patient that there may be a patient responsible charge related to this service. The patient expressed understanding and agreed to proceed.  Patient location: home Provider location: home  Diagnosis: COVID-19 infection  Purpose of visit: Discussion of potential use of Molnupiravir or Paxlovid, a new treatment for mild to moderate COVID-19 viral infection in non-hospitalized patients.   Subjective: Patient is a 35 y.o. female who has been diagnosed with COVID 19 viral infection.  Their symptoms began on 09/11/2020 with cough/congestion.    Past Medical History:  Diagnosis Date  . Abnormal vaginal Pap smear   . Anxiety   . Cystic acne   . Depression   . GERD (gastroesophageal reflux disease)     Allergies  Allergen Reactions  . Z-Pak [Azithromycin] Hives     Current Outpatient Medications:  .  acetaminophen (TYLENOL) 325 MG tablet, Take 2 tablets (650 mg total) by mouth every 6 (six) hours as needed for mild pain (or temp > 100)., Disp:  , Rfl:  .  Biotin 1000 MCG tablet, Take 1,000 mcg by mouth daily. , Disp: , Rfl:  .  buPROPion (WELLBUTRIN XL) 150 MG 24 hr tablet, TAKE 2 TABLETS BY MOUTH IN THE MORNING., Disp: 180 tablet, Rfl: 1 .  dicyclomine (BENTYL) 10 MG capsule, Take 1 capsule (10 mg total) by mouth 3 (three) times daily as needed for spasms (bloating, frequent stool)., Disp: 90 capsule, Rfl: 0 .  esomeprazole (NEXIUM) 40 MG capsule, Take 1 capsule (40 mg total) by mouth 2 (two) times daily. On an empty stomach (NEEDS TO BE SEEN BEFORE NEXT REFILL), Disp: 60 capsule, Rfl: 0 .  FLUoxetine (PROZAC)  40 MG capsule, Take 1 capsule (40 mg total) by mouth daily., Disp: 90 capsule, Rfl: 1 .  ibuprofen (ADVIL) 200 MG tablet, Take 400 mg by mouth daily as needed for headache., Disp: , Rfl:  .  promethazine-dextromethorphan (PROMETHAZINE-DM) 6.25-15 MG/5ML syrup, Take 5 mLs by mouth 4 (four) times daily as needed for cough., Disp: 118 mL, Rfl: 0  Objective: Patient appears/sounds hoarse.  They are in no apparent distress.  Breathing is non labored.  Mood and behavior are normal.  Laboratory Data:  Recent Results (from the past 2160 hour(s))  Novel Coronavirus, NAA ([facility])     Status: Abnormal   Collection Time: 09/11/20  6:24 PM   Specimen: Nasopharyngeal(NP) swabs in vial transport medium   Nasopharynge  Patient  Result Value Ref Range   SARS-CoV-2, NAA Detected (A) Not Detected    Comment: Patients who have a positive COVID-19 test result may now have treatment options. Treatment options are available for patients with mild to moderate symptoms and for hospitalized patients. Visit our website at [email or web address] for resources and information. This nucleic acid amplification test was developed and its performance characteristics determined by [employer]. Nucleic acid amplification tests include RT-PCR and TMA. This test has not been FDA cleared or approved. This test has been authorized by FDA under an Emergency Use Authorization (EUA). This test is only authorized for the duration of time the declaration that circumstances exist justifying the authorization of the emergency use of in vitro diagnostic tests for  detection of SARS-CoV-2 virus and/or diagnosis of COVID-19 infection under section 564(b)(1) of the Act, 21 U.S.C. 993TTS-1(X) (1), unless the authorization is terminated or revoked sooner. When diagnostic testing is negativ e, the possibility of a false negative result should be considered in the context of a patient's recent exposures and the presence  of clinical signs and symptoms consistent with COVID-19. An individual without symptoms of COVID-19 and who is not shedding SARS-CoV-2 virus would expect to have a negative (not detected) result in this assay.   SARS-COV-2, NAA 2 DAY TAT     Status: None   Collection Time: 09/11/20  6:24 PM   Nasopharynge  Patient  Result Value Ref Range   SARS-CoV-2, NAA 2 DAY TAT Performed      Assessment: 35 y.o. female with mild/moderate COVID 19 viral infection diagnosed on 09/12/2020 at high risk for progression to severe COVID 19.  Plan:  This patient is a 35 y.o. female that meets the following criteria for Emergency Use Authorization of: Molnupiravir  1. Age >18 yr 2. SARS-COV-2 positive test 3. Symptom onset < 5 days 4. Mild-to-moderate COVID disease with high risk for severe progression to hospitalization or death   I have spoken and communicated the following to the patient or parent/caregiver regarding: 1. Molnupiravir is an unapproved drug that is authorized for use under an TEFL teacher.  2. There are no adequate, approved, available products for the treatment of COVID-19 in adults who have mild-to-moderate COVID-19 and are at high risk for progressing to severe COVID-19, including hospitalization or death. 3. Other therapeutics are currently authorized. For additional information on all products authorized for treatment or prevention of COVID-19, please see https://[email or web address].graham-[email or web address]/.  4. There are benefits and risks of taking this treatment as outlined in the "Fact Sheet for Patients and Caregivers."  5. "Fact Sheet for Patients and Caregivers" was reviewed with patient. A hard copy will be provided to patient from pharmacy prior to the patient receiving treatment. 6. Patients should continue to self-isolate and use infection control measures (e.g., wear mask, isolate, social  distance, avoid sharing personal items, clean and disinfect "high touch" surfaces, and frequent handwashing) according to CDC guidelines.  7. The patient or parent/caregiver has the option to accept or refuse treatment. 8. [employer] has established a pregnancy surveillance program. 9. Females of childbearing potential should use a reliable method of contraception correctly and consistently, as applicable, for the duration of treatment and for 4 days after the last dose of Molnupiravir. 10. Males of reproductive potential who are sexually active with females of childbearing potential should use a reliable method of contraception correctly and consistently during treatment and for at least 3 months after the last dose. 11. Pregnancy status and risk was assessed. Patient verbalized understanding of precautions.   After reviewing above information with the patient, the patient agrees to receive molnupiravir.  Follow up instructions:    . Take prescription BID x 5 days as directed . Reach out to pharmacist for counseling on medication if desired . For concerns regarding further COVID symptoms please follow up with your PCP or urgent care . For urgent or life-threatening issues, seek care at your local emergency department  The patient was provided an opportunity to ask questions, and all were answered. The patient agreed with the plan and demonstrated an understanding of the instructions.   Script sent to [facility] and opted to pick up RX.  The patient was advised to call their PCP  or seek an in-person evaluation if the symptoms worsen or if the condition fails to improve as anticipated.   I provided 10 minutes of non face-to-face telephone visit time during this encounter, and > 50% was spent counseling as documented under my assessment & plan.  Noreene Filbert, NP 09/13/2020 /12:27 PM

## 2020-09-25 DIAGNOSIS — Z309 Encounter for contraceptive management, unspecified: Secondary | ICD-10-CM | POA: Diagnosis not present

## 2020-09-25 DIAGNOSIS — Z01818 Encounter for other preprocedural examination: Secondary | ICD-10-CM | POA: Diagnosis not present

## 2020-09-25 DIAGNOSIS — Z30433 Encounter for removal and reinsertion of intrauterine contraceptive device: Secondary | ICD-10-CM | POA: Diagnosis not present

## 2020-11-14 DIAGNOSIS — Z30431 Encounter for routine checking of intrauterine contraceptive device: Secondary | ICD-10-CM | POA: Diagnosis not present

## 2020-11-14 DIAGNOSIS — K30 Functional dyspepsia: Secondary | ICD-10-CM | POA: Diagnosis not present

## 2020-11-14 DIAGNOSIS — E669 Obesity, unspecified: Secondary | ICD-10-CM | POA: Diagnosis not present

## 2020-11-14 DIAGNOSIS — Z309 Encounter for contraceptive management, unspecified: Secondary | ICD-10-CM | POA: Diagnosis not present

## 2020-11-14 DIAGNOSIS — F32A Depression, unspecified: Secondary | ICD-10-CM | POA: Diagnosis not present

## 2020-11-14 DIAGNOSIS — Z975 Presence of (intrauterine) contraceptive device: Secondary | ICD-10-CM | POA: Diagnosis not present

## 2020-11-29 IMAGING — CT CT ABD-PELV W/ CM
2 of 4 series · 14 of 46 positions shown, 16 images · IV contrast (APPLIED)
Comparison: None.
COMPARISON: None.

Addendum:
CLINICAL DATA: Abdominal pain

EXAM:
CT ABDOMEN AND PELVIS WITH CONTRAST
TECHNIQUE: Multidetector CT imaging of the abdomen and pelvis was performed
using the standard protocol following bolus administration of
intravenous contrast.
CONTRAST:  100mL OMNIPAQUE IOHEXOL 300 MG/ML  SOLN

[Series 3: abdomen 5.0 · axial · 0.98mm/px · z∈[+700,+1096]mm · 11 of 91 slices shown, 13 images]
[im 6/91  soft-tissue]
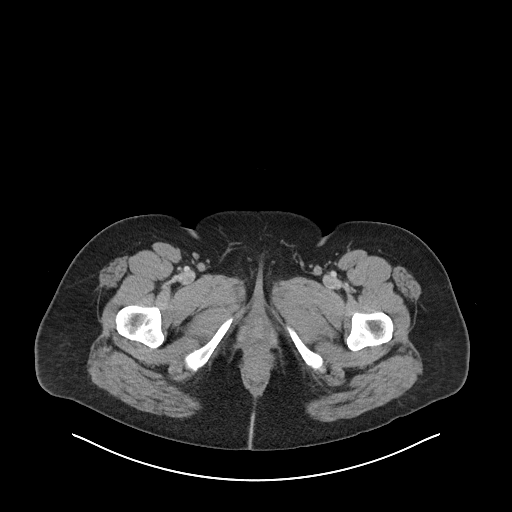
[im 6/91  bone]
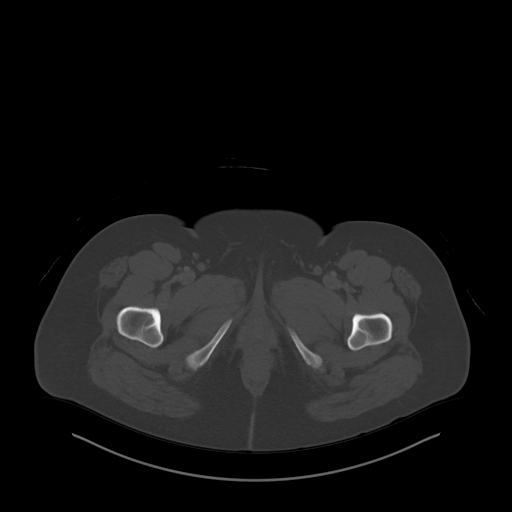
[im 17/91  soft-tissue]
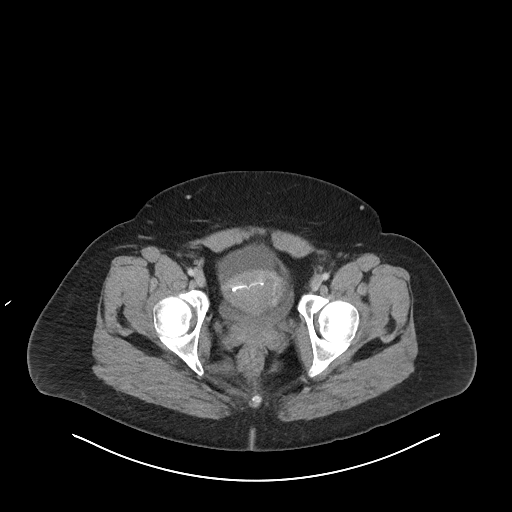
[im 23/91  soft-tissue]
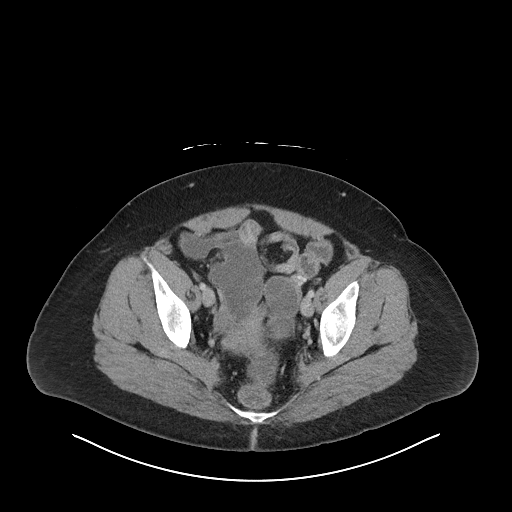
[im 29/91  soft-tissue]
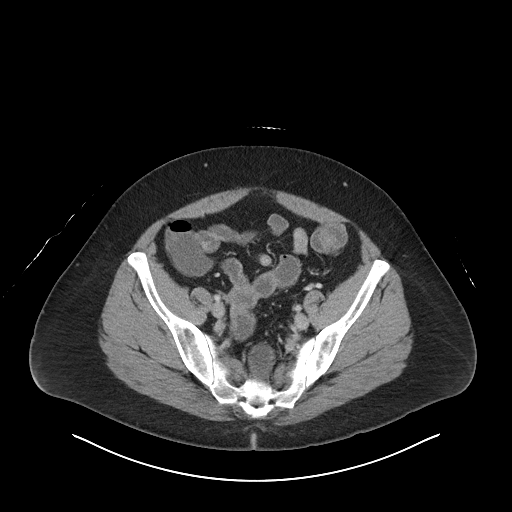
[im 40/91  soft-tissue]
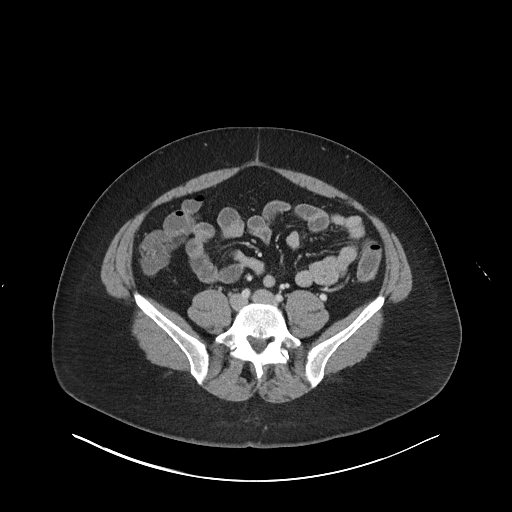
[im 46/91  soft-tissue]
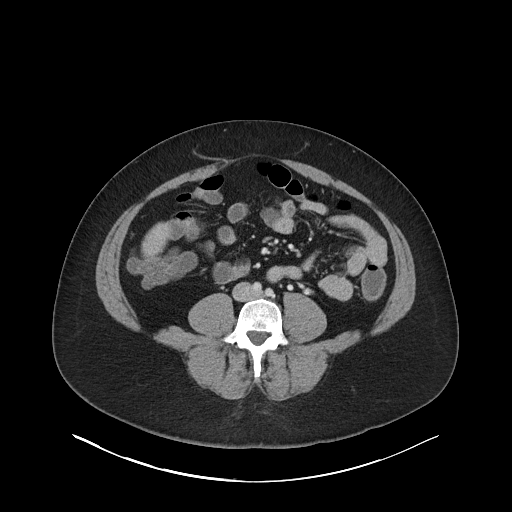
[im 51/91  soft-tissue]
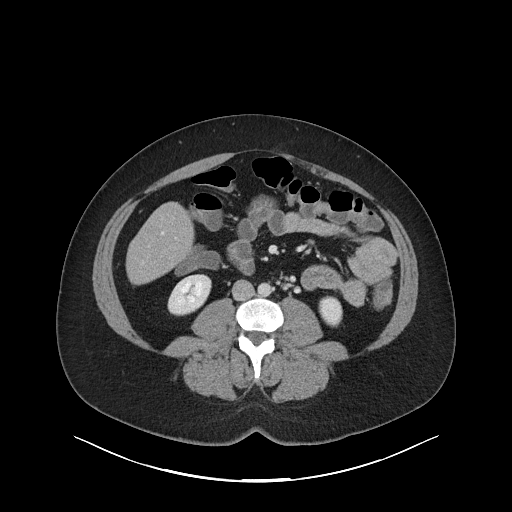
[im 62/91  soft-tissue]
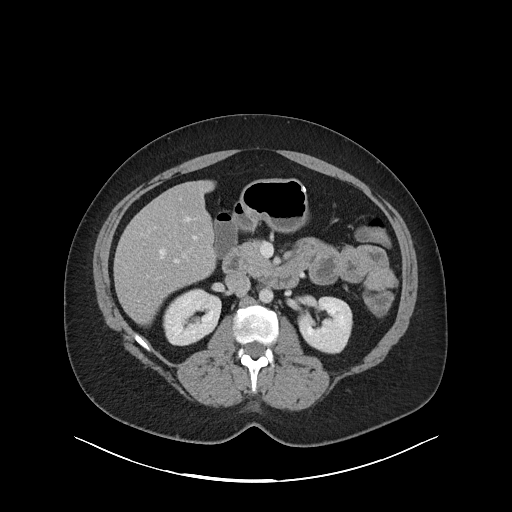
[im 68/91  soft-tissue]
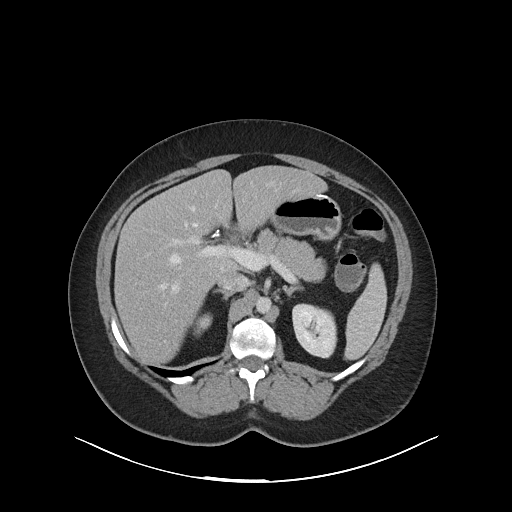
[im 68/91  bone]
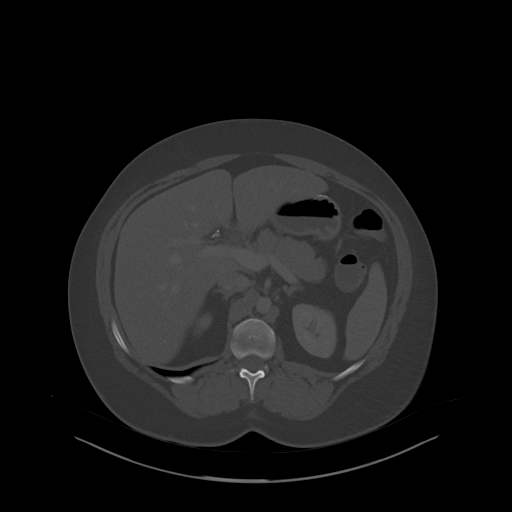
[im 74/91  soft-tissue]
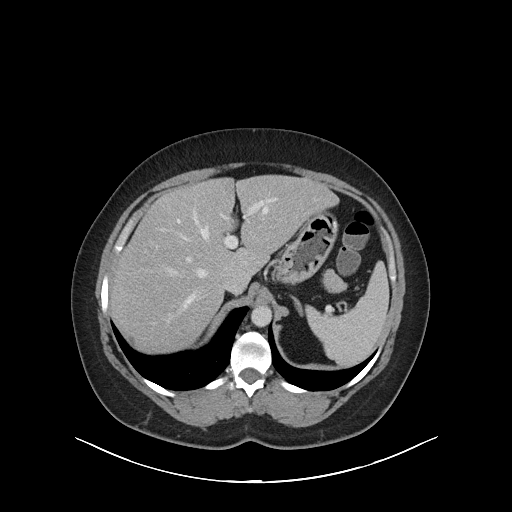
[im 85/91  soft-tissue]
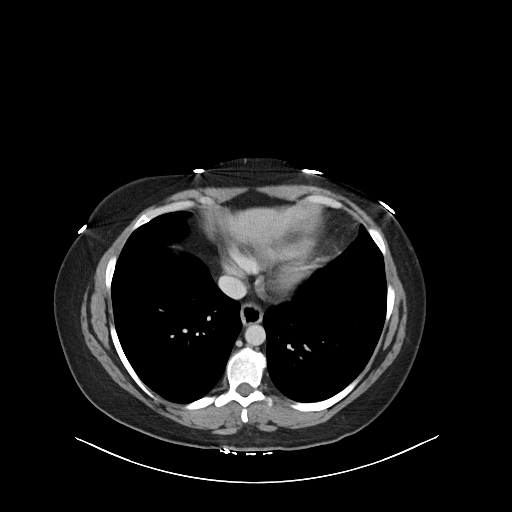

[Series 6: abdomen 3.0 mpr cor · coronal · 0.87mm/px · 3 of 112 slices shown]
[im 38/112  soft-tissue]
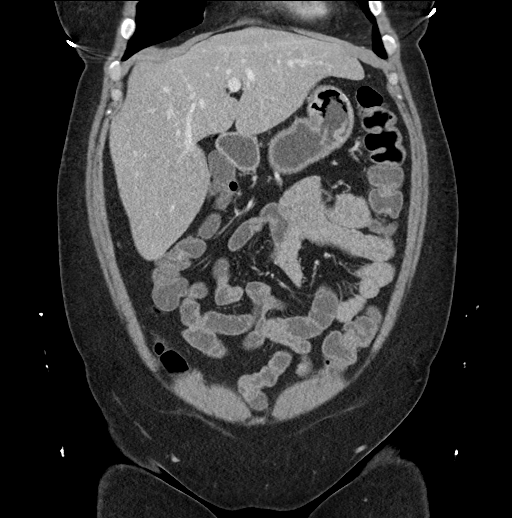
[im 50/112  soft-tissue]
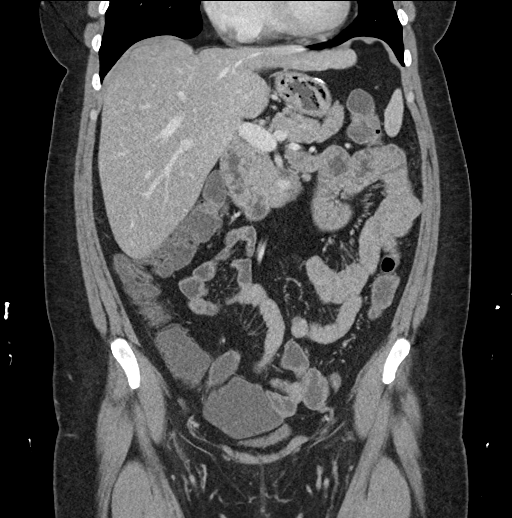
[im 62/112  soft-tissue]
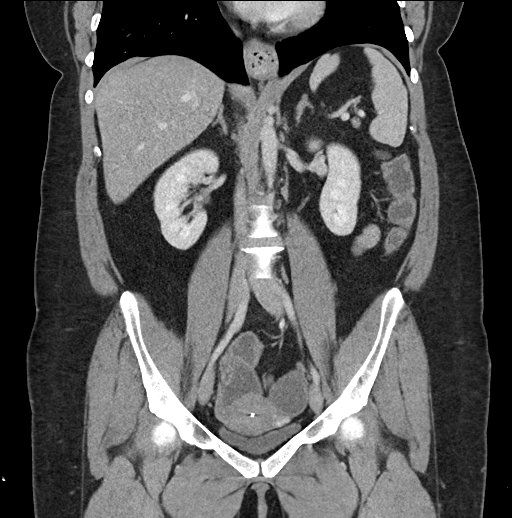

[14 of 46 positions shown; findings below may reference images not displayed]

FINDINGS: Lower chest: The visualized heart size within normal limits. No
pericardial fluid/thickening.

No hiatal hernia.

The visualized portions of the lungs are clear.

Hepatobiliary: The liver is normal in density without focal
abnormality.The main portal vein is patent. No evidence of calcified
gallstones, gallbladder wall thickening or biliary dilatation.

Pancreas: Unremarkable. No pancreatic ductal dilatation or
surrounding inflammatory changes.

Spleen: Normal in size without focal abnormality.

Adrenals/Urinary Tract: Both adrenal glands appear normal. The
kidneys and collecting system appear normal without evidence of
urinary tract calculus or hydronephrosis. Bladder is decompressed.

Stomach/Bowel: The patient is status post gastric sleeve resection.
A small hiatal hernia is present with food debris. The small bowel
is unremarkable. Within the right lower quadrant there is a complex
fluid collection with small foci of air seen within it measuring
x 4.2 cm extending from the distal cecal tip, which is likely due to
perforated appendicitis. There is a small tubular air-filled
structure seen within right lower quadrant, series 3, image 61 which
could be a tiny focal portion of the appendix.

Vascular/Lymphatic: There are no enlarged mesenteric,
retroperitoneal, or pelvic lymph nodes. No significant vascular
findings are present.

Reproductive: IUD is seen within the endometrial canal. Within the
left adnexa there is a 4.1 cm low-density lesion, likely ovarian
cyst.

Other: No evidence of abdominal wall mass or hernia.

Musculoskeletal: No acute or significant osseous findings.
IMPRESSION: Complex loculated fluid collection within the right lower quadrant
measuring 9.6 x 4.2 cm extending from the the distal cecal tip which
is likely periappendiceal or pericolonic abscess with perforation.

ADDENDUM:
Addendum created after patient was referred for evaluation of
possible image guided drainage of the abscess.
FINDINGS: The length of the colon is fluid-filled, and this includes the
cecum. Coronal images best demonstrate the cecum on top of the right
broad ligament/ovary, overlying the uterus. Normal ileocecal valve
is identified on image 48 of series 6. A normal appendix is
visualized on image sequence [id number] of series 6. There is no
appendicolith.

No inflammatory changes within the adjacent fat. No extraluminal
air.
IMPRESSION: The CT is negative for acute appendicitis, within normal appendix
visualized, and negative for pelvic abscess, with a fluid-filled
right colon/cecum. The findings most likely represent a nonspecific
enteritis/colitis.

These results were discussed with Ms Shorena Akobia Mavra, PA, at the time
of interpretation on 06/04/2019 at [DATE].

*** End of Addendum ***
FINDINGS: Lower chest: The visualized heart size within normal limits. No
pericardial fluid/thickening.

No hiatal hernia.

The visualized portions of the lungs are clear.

Hepatobiliary: The liver is normal in density without focal
abnormality.The main portal vein is patent. No evidence of calcified
gallstones, gallbladder wall thickening or biliary dilatation.

Pancreas: Unremarkable. No pancreatic ductal dilatation or
surrounding inflammatory changes.

Spleen: Normal in size without focal abnormality.

Adrenals/Urinary Tract: Both adrenal glands appear normal. The
kidneys and collecting system appear normal without evidence of
urinary tract calculus or hydronephrosis. Bladder is decompressed.

Stomach/Bowel: The patient is status post gastric sleeve resection.
A small hiatal hernia is present with food debris. The small bowel
is unremarkable. Within the right lower quadrant there is a complex
fluid collection with small foci of air seen within it measuring
x 4.2 cm extending from the distal cecal tip, which is likely due to
perforated appendicitis. There is a small tubular air-filled
structure seen within right lower quadrant, series 3, image 61 which
could be a tiny focal portion of the appendix.

Vascular/Lymphatic: There are no enlarged mesenteric,
retroperitoneal, or pelvic lymph nodes. No significant vascular
findings are present.

Reproductive: IUD is seen within the endometrial canal. Within the
left adnexa there is a 4.1 cm low-density lesion, likely ovarian
cyst.

Other: No evidence of abdominal wall mass or hernia.

Musculoskeletal: No acute or significant osseous findings.
IMPRESSION: Complex loculated fluid collection within the right lower quadrant
measuring 9.6 x 4.2 cm extending from the the distal cecal tip which
is likely periappendiceal or pericolonic abscess with perforation.

## 2020-12-16 DIAGNOSIS — Z6834 Body mass index (BMI) 34.0-34.9, adult: Secondary | ICD-10-CM | POA: Diagnosis not present

## 2020-12-16 DIAGNOSIS — E6609 Other obesity due to excess calories: Secondary | ICD-10-CM | POA: Diagnosis not present

## 2020-12-23 DIAGNOSIS — Z6834 Body mass index (BMI) 34.0-34.9, adult: Secondary | ICD-10-CM | POA: Diagnosis not present

## 2020-12-23 DIAGNOSIS — E6609 Other obesity due to excess calories: Secondary | ICD-10-CM | POA: Diagnosis not present

## 2020-12-23 DIAGNOSIS — K219 Gastro-esophageal reflux disease without esophagitis: Secondary | ICD-10-CM | POA: Diagnosis not present

## 2020-12-23 DIAGNOSIS — Z713 Dietary counseling and surveillance: Secondary | ICD-10-CM | POA: Diagnosis not present

## 2020-12-23 DIAGNOSIS — Z79899 Other long term (current) drug therapy: Secondary | ICD-10-CM | POA: Diagnosis not present

## 2020-12-31 DIAGNOSIS — Z6833 Body mass index (BMI) 33.0-33.9, adult: Secondary | ICD-10-CM | POA: Diagnosis not present

## 2020-12-31 DIAGNOSIS — F32A Depression, unspecified: Secondary | ICD-10-CM | POA: Diagnosis not present

## 2020-12-31 DIAGNOSIS — Z713 Dietary counseling and surveillance: Secondary | ICD-10-CM | POA: Diagnosis not present

## 2020-12-31 DIAGNOSIS — Z79899 Other long term (current) drug therapy: Secondary | ICD-10-CM | POA: Diagnosis not present

## 2020-12-31 DIAGNOSIS — E6609 Other obesity due to excess calories: Secondary | ICD-10-CM | POA: Diagnosis not present

## 2020-12-31 DIAGNOSIS — F419 Anxiety disorder, unspecified: Secondary | ICD-10-CM | POA: Diagnosis not present

## 2021-01-04 ENCOUNTER — Other Ambulatory Visit: Payer: Self-pay | Admitting: Family Medicine

## 2021-01-04 DIAGNOSIS — F339 Major depressive disorder, recurrent, unspecified: Secondary | ICD-10-CM

## 2021-01-04 DIAGNOSIS — F411 Generalized anxiety disorder: Secondary | ICD-10-CM

## 2021-01-06 MED ORDER — FLUOXETINE HCL 40 MG PO CAPS: 40.0000 mg | ORAL_CAPSULE | Freq: Every day | ORAL | 1 refills | Status: AC

## 2021-01-16 DIAGNOSIS — Z713 Dietary counseling and surveillance: Secondary | ICD-10-CM | POA: Diagnosis not present

## 2021-01-16 DIAGNOSIS — Z6831 Body mass index (BMI) 31.0-31.9, adult: Secondary | ICD-10-CM | POA: Diagnosis not present

## 2021-01-16 DIAGNOSIS — E6609 Other obesity due to excess calories: Secondary | ICD-10-CM | POA: Diagnosis not present

## 2021-01-16 DIAGNOSIS — K219 Gastro-esophageal reflux disease without esophagitis: Secondary | ICD-10-CM | POA: Diagnosis not present

## 2021-01-29 ENCOUNTER — Ambulatory Visit
Admission: EM | Admit: 2021-01-29 | Discharge: 2021-01-29 | Disposition: A | Discharge status: Home / Self Care | Payer: Medicaid Other | Attending: Family Medicine | Admitting: Family Medicine

## 2021-01-29 ENCOUNTER — Other Ambulatory Visit: Payer: Self-pay

## 2021-01-29 DIAGNOSIS — J0100 Acute maxillary sinusitis, unspecified: Secondary | ICD-10-CM

## 2021-01-29 MED ORDER — AMOXICILLIN 875 MG PO TABS: 875.0000 mg | ORAL_TABLET | Freq: Two times a day (BID) | ORAL | 0 refills | Status: AC

## 2021-01-29 NOTE — ED Triage Notes (Signed)
Triaged by provider  

## 2021-01-29 NOTE — ED Provider Notes (Signed)
[facility]   [id number] 01/29/21 Arrival Time: 0820  ASSESSMENT & PLAN:  1. Acute non-recurrent maxillary sinusitis    Begin: Meds ordered this encounter  Medications   amoxicillin (AMOXIL) 875 MG tablet    Sig: Take 1 tablet (875 mg total) by mouth 2 (two) times daily for 10 days.    Dispense:  20 tablet    Refill:  0   Discussed typical duration of symptoms. OTC symptom care as needed. Ensure adequate fluid intake and rest.   Follow-up Information     Mechele Claude, MD.   Specialty: Family Medicine Why: If worsening or failing to improve as anticipated. Contact information: [address] [place] [address] [phone]                 Reviewed expectations re: course of current medical issues. Questions answered. Outlined signs and symptoms indicating need for more acute intervention. Patient verbalized understanding. After Visit Summary given.   SUBJECTIVE: History from: patient.  Anna Blake is a 35 y.o. female who presents with complaint of nasal congestion, post-nasal drainage, and sinus pain. Onset gradual,  past week . Not improving. Respiratory symptoms: none. Fever: absent. Overall normal PO intake without n/v. OTC treatment: none. Seasonal allergies: yes. History of frequent sinus infections: no. No specific aggravating or alleviating factors reported. Social History   Tobacco Use  Smoking Status Former  Smokeless Tobacco Never    ROS: As per HPI.  OBJECTIVE:  Vitals:   01/29/21 0838 01/29/21 0843  BP: 122/87   Pulse: 97 (!) 110  Resp: 16 (!) 24  Temp: 98.5 F (36.9 C) 98 F (36.7 C)  TempSrc: Oral Tympanic  SpO2: 98% 97%  Weight:  15.2 kg     General appearance: alert; no distress HEENT: nasal congestion; clear runny nose; throat irritation secondary to post-nasal drainage; bilateral maxillary tenderness to palpation; turbinates boggy Neck: supple without LAD; trachea midline Lungs: unlabored respirations,  symmetrical air entry; cough: absent; no respiratory distress Skin: warm and dry Psychological: alert and cooperative; normal mood and affect  Allergies  Allergen Reactions   Z-Pak [Azithromycin] Hives    Past Medical History:  Diagnosis Date   Abnormal vaginal Pap smear    Anxiety    Cystic acne    Depression    GERD (gastroesophageal reflux disease)    Family History  Problem Relation Age of Onset   Congestive Heart Failure Father    Hyperlipidemia Father    Hypertension Father    Colon cancer Father        47   Heart disease Maternal Grandmother    Hypertension Maternal Grandmother    Hyperlipidemia Maternal Grandmother    Diabetes Maternal Grandmother    Cancer Maternal Grandfather        prostate   Colon cancer Other        great grandfather   Social History   Socioeconomic History   Marital status: Single    Spouse name: Not on file   Number of children: 1   Years of education: Not on file   Highest education level: Not on file  Occupational History   Not on file  Tobacco Use   Smoking status: Former   Smokeless tobacco: Never  Vaping Use   Vaping Use: Never used  Substance and Sexual Activity   Alcohol use: Not Currently    Comment: social    Drug use: Never   Sexual activity: Yes    Partners: Male  Other  Topics Concern   Not on file  Social History Narrative   Not on file   Social Determinants of Health   Financial Resource Strain: Not on file  Food Insecurity: Not on file  Transportation Needs: Not on file  Physical Activity: Not on file  Stress: Not on file  Social Connections: Not on file  Intimate Partner Violence: Not on file             Mardella Layman, MD 01/29/21 [phone]

## 2021-01-30 DIAGNOSIS — Z713 Dietary counseling and surveillance: Secondary | ICD-10-CM | POA: Diagnosis not present

## 2021-01-30 DIAGNOSIS — E6609 Other obesity due to excess calories: Secondary | ICD-10-CM | POA: Diagnosis not present

## 2021-01-30 DIAGNOSIS — Z6831 Body mass index (BMI) 31.0-31.9, adult: Secondary | ICD-10-CM | POA: Diagnosis not present

## 2021-01-30 DIAGNOSIS — J0100 Acute maxillary sinusitis, unspecified: Secondary | ICD-10-CM | POA: Diagnosis not present

## 2021-02-11 DIAGNOSIS — F32A Depression, unspecified: Secondary | ICD-10-CM | POA: Diagnosis not present

## 2021-02-11 DIAGNOSIS — Z713 Dietary counseling and surveillance: Secondary | ICD-10-CM | POA: Diagnosis not present

## 2021-02-11 DIAGNOSIS — Z6831 Body mass index (BMI) 31.0-31.9, adult: Secondary | ICD-10-CM | POA: Diagnosis not present

## 2021-02-11 DIAGNOSIS — E6609 Other obesity due to excess calories: Secondary | ICD-10-CM | POA: Diagnosis not present

## 2021-02-11 DIAGNOSIS — F419 Anxiety disorder, unspecified: Secondary | ICD-10-CM | POA: Diagnosis not present

## 2021-02-25 DIAGNOSIS — Z6830 Body mass index (BMI) 30.0-30.9, adult: Secondary | ICD-10-CM | POA: Diagnosis not present

## 2021-02-25 DIAGNOSIS — R748 Abnormal levels of other serum enzymes: Secondary | ICD-10-CM | POA: Diagnosis not present

## 2021-02-25 DIAGNOSIS — Z713 Dietary counseling and surveillance: Secondary | ICD-10-CM | POA: Diagnosis not present

## 2021-02-25 DIAGNOSIS — E6609 Other obesity due to excess calories: Secondary | ICD-10-CM | POA: Diagnosis not present

## 2021-03-11 DIAGNOSIS — E6609 Other obesity due to excess calories: Secondary | ICD-10-CM | POA: Diagnosis not present

## 2021-03-11 DIAGNOSIS — Z713 Dietary counseling and surveillance: Secondary | ICD-10-CM | POA: Diagnosis not present

## 2021-03-11 DIAGNOSIS — Z6830 Body mass index (BMI) 30.0-30.9, adult: Secondary | ICD-10-CM | POA: Diagnosis not present

## 2021-03-25 ENCOUNTER — Ambulatory Visit
Admission: EM | Admit: 2021-03-25 | Discharge: 2021-03-25 | Disposition: A | Discharge status: Home / Self Care | Payer: Medicaid Other

## 2021-03-25 ENCOUNTER — Encounter: Payer: Self-pay | Admitting: Emergency Medicine

## 2021-03-25 ENCOUNTER — Other Ambulatory Visit: Payer: Self-pay

## 2021-03-25 DIAGNOSIS — J069 Acute upper respiratory infection, unspecified: Secondary | ICD-10-CM | POA: Diagnosis not present

## 2021-03-25 MED ORDER — PROMETHAZINE-DM 6.25-15 MG/5ML PO SYRP: 2.5000 mL | ORAL_SOLUTION | Freq: Four times a day (QID) | ORAL | 0 refills | Status: AC | PRN

## 2021-03-25 NOTE — Discharge Instructions (Addendum)
-  Promethazine DM cough syrup for congestion/cough. This could make you drowsy, so take at night before bed. -Mucinex or similar during the day -With a virus, you're typically contagious for 5-7 days, or as long as you're having fevers.  -You can take Tylenol up to 1000 mg 3 times daily, and ibuprofen up to 600 mg 3 times daily with food.  You can take these together, or alternate every 3-4 hours.

## 2021-03-25 NOTE — ED Provider Notes (Signed)
[facility]    CSN: [id number] Arrival date & time: 03/25/21  0801      History   Chief Complaint Chief Complaint  Patient presents with   Cough    HPI Anna Blake is a 35 y.o. female presenting with 2 days of viral symptoms.  Describes nonproductive cough, fatigue, nasal congestion, sore throat, generalized body aches.  Denies fever/chills, has not monitored her temperature at home.  Last antipyretic was about 6 hours ago.  Denies history of pulmonary disease, denies shortness of breath.  HPI  Past Medical History:  Diagnosis Date   Abnormal vaginal Pap smear    Anxiety    Cystic acne    Depression    GERD (gastroesophageal reflux disease)     Patient Active Problem List   Diagnosis Date Noted   Abnormal CT scan, colon 07/18/2019   Leukocytosis 07/18/2019   Nausea with vomiting 06/04/2019   Diarrhea 06/04/2019   Irritable bowel syndrome with predominant constipation 06/04/2019   Gastroenteritis 06/03/2019   Mixed hyperlipidemia 05/25/2019   GAD (generalized anxiety disorder) 03/15/2019   Depression, recurrent ([place]) 03/15/2019   Cystic acne vulgaris 03/15/2019   BMI 36.0-36.9,adult 03/15/2019   IUD (intrauterine device) in place 10/26/2018   Former tobacco use 10/26/2018   History of cholecystectomy 10/26/2018   History of tonsillectomy 10/26/2018   History of bariatric surgery 10/26/2018   History of abnormal cervical Pap smear 10/26/2018   Anxiety and depression 10/26/2018   Gastroesophageal reflux disease without esophagitis 10/26/2018   Abnormal weight gain 10/26/2018    Past Surgical History:  Procedure Laterality Date   BIOPSY  09/07/2019   Procedure: BIOPSY;  Surgeon: Daneil Dolin, MD;  Location: [facility];  Service: Endoscopy;;   carpel tunnel; release 02/03/19 Dr Amedeo Plenty  Bilateral    CESAREAN SECTION     CHOLECYSTECTOMY     COLONOSCOPY WITH PROPOFOL N/A 09/07/2019   Procedure: COLONOSCOPY WITH PROPOFOL;  Surgeon: Daneil Dolin, MD;  Location: [facility];  Service: Endoscopy;  Laterality: N/A;  1:30pm   EXPLORATORY LAPAROTOMY     endometriosis   LAPAROSCOPIC GASTRIC SLEEVE RESECTION  2014   POLYPECTOMY  09/07/2019   Procedure: POLYPECTOMY;  Surgeon: Daneil Dolin, MD;  Location: [facility];  Service: Endoscopy;;   TONSILLECTOMY      OB History   No obstetric history on file.      Home Medications    Prior to Admission medications   Medication Sig Start Date End Date Taking? Authorizing Provider  esomeprazole (NEXIUM) 40 MG capsule Take 1 capsule (40 mg total) by mouth 2 (two) times daily. On an empty stomach (NEEDS TO BE SEEN BEFORE NEXT REFILL) 09/04/20  Yes Stacks, Cletus Gash, MD  FLUoxetine (PROZAC) 40 MG capsule Take 40 mg by mouth daily.   Yes [provider]  ibuprofen (ADVIL) 200 MG tablet Take 400 mg by mouth daily as needed for headache.   Yes [provider]  linaclotide (LINZESS) 72 MCG capsule Take 1 capsule by mouth daily. 07/26/20  Yes [provider]  Phentermine HCl 8 MG TABS Take by mouth. 03/11/21 04/10/21 Yes [provider]  promethazine-dextromethorphan (PROMETHAZINE-DM) 6.25-15 MG/5ML syrup Take 2.5 mLs by mouth 4 (four) times daily as needed for cough. 03/25/21  Yes Hazel Sams, PA-C  topiramate (TOPAMAX) 50 MG tablet Take 50 mg by mouth 2 (two) times daily.   Yes [provider]  acetaminophen (TYLENOL) 325 MG tablet Take 2 tablets (650  mg total) by mouth every 6 (six) hours as needed for mild pain (or temp > 100). 06/05/19   Norm Parcel, PA-C  Biotin 1000 MCG tablet Take 1,000 mcg by mouth daily.     [provider]  buPROPion (WELLBUTRIN XL) 150 MG 24 hr tablet TAKE 2 TABLETS BY MOUTH IN THE MORNING. Patient not taking: Reported on 03/25/2021 01/08/20   Claretta Fraise, MD  dicyclomine (BENTYL) 10 MG capsule Take 1 capsule (10 mg total) by mouth 3 (three) times daily as needed for spasms (bloating, frequent stool). 07/27/19    Mahala Menghini, PA-C  FLUoxetine (PROZAC) 40 MG capsule Take 1 capsule (40 mg total) by mouth daily. 01/06/21 02/05/21  Claretta Fraise, MD    Family History Family History  Problem Relation Age of Onset   Congestive Heart Failure Father    Hyperlipidemia Father    Hypertension Father    Colon cancer Father        76   Heart disease Maternal Grandmother    Hypertension Maternal Grandmother    Hyperlipidemia Maternal Grandmother    Diabetes Maternal Grandmother    Cancer Maternal Grandfather        prostate   Colon cancer Other        great grandfather    Social History Social History   Tobacco Use   Smoking status: Former   Smokeless tobacco: Never  Scientific laboratory technician Use: Never used  Substance Use Topics   Alcohol use: Not Currently    Comment: social    Drug use: Never     Allergies   Z-pak [azithromycin]   Review of Systems Review of Systems  Constitutional:  Negative for appetite change, chills and fever.  HENT:  Positive for congestion. Negative for ear pain, rhinorrhea, sinus pressure, sinus pain and sore throat.   Eyes:  Negative for redness and visual disturbance.  Respiratory:  Positive for cough. Negative for chest tightness, shortness of breath and wheezing.   Cardiovascular:  Negative for chest pain and palpitations.  Gastrointestinal:  Negative for abdominal pain, constipation, diarrhea, nausea and vomiting.  Genitourinary:  Negative for dysuria, frequency and urgency.  Musculoskeletal:  Positive for myalgias.  Neurological:  Negative for dizziness, weakness and headaches.  Psychiatric/Behavioral:  Negative for confusion.   All other systems reviewed and are negative.   Physical Exam Triage Vital Signs ED Triage Vitals  Enc Vitals Group     BP 03/25/21 0818 122/81     Pulse Rate 03/25/21 0818 93     Resp 03/25/21 0818 20     Temp 03/25/21 0818 98 F (36.7 C)     Temp Source 03/25/21 0818 Oral     SpO2 03/25/21 0818 98 %     Weight --       Height --      Head Circumference --      Peak Flow --      Pain Score 03/25/21 0812 7     Pain Loc --      Pain Edu? --      Excl. in [place]? --    No data found.  Updated Vital Signs BP 122/81 (BP Location: Right Arm)   Pulse 93   Temp 98 F (36.7 C) (Oral)   Resp 20   SpO2 98%   Visual Acuity Right Eye Distance:   Left Eye Distance:   Bilateral Distance:    Right Eye Near:   Left Eye Near:  Bilateral Near:     Physical Exam Vitals reviewed.  Constitutional:      General: She is not in acute distress.    Appearance: Normal appearance. She is not ill-appearing.  HENT:     Head: Normocephalic and atraumatic.     Right Ear: Tympanic membrane, ear canal and external ear normal. No tenderness. No middle ear effusion. There is no impacted cerumen. Tympanic membrane is not perforated, erythematous, retracted or bulging.     Left Ear: Tympanic membrane, ear canal and external ear normal. No tenderness.  No middle ear effusion. There is no impacted cerumen. Tympanic membrane is not perforated, erythematous, retracted or bulging.     Nose: Congestion present.     Mouth/Throat:     Mouth: Mucous membranes are moist.     Pharynx: Uvula midline. No oropharyngeal exudate or posterior oropharyngeal erythema.  Eyes:     Extraocular Movements: Extraocular movements intact.     Pupils: Pupils are equal, round, and reactive to light.  Cardiovascular:     Rate and Rhythm: Normal rate and regular rhythm.     Heart sounds: Normal heart sounds.  Pulmonary:     Effort: Pulmonary effort is normal.     Breath sounds: Normal breath sounds. No decreased breath sounds, wheezing, rhonchi or rales.  Abdominal:     Palpations: Abdomen is soft.     Tenderness: There is no abdominal tenderness. There is no guarding or rebound.  Lymphadenopathy:     Cervical: No cervical adenopathy.     Right cervical: No superficial cervical adenopathy.    Left cervical: No superficial cervical adenopathy.   Neurological:     General: No focal deficit present.     Mental Status: She is alert and oriented to person, place, and time.  Psychiatric:        Mood and Affect: Mood normal.        Behavior: Behavior normal.        Thought Content: Thought content normal.        Judgment: Judgment normal.     UC Treatments / Results  Labs (all labs ordered are listed, but only abnormal results are displayed) Labs Reviewed  COVID-19, FLU A+B NAA    EKG   Radiology No results found.  Procedures Procedures (including critical care time)  Medications Ordered in UC Medications - No data to display  Initial Impression / Assessment and Plan / UC Course  I have reviewed the triage vital signs and the nursing notes.  Pertinent labs & imaging results that were available during my care of the patient were reviewed by me and considered in my medical decision making (see chart for details).     This patient is a very pleasant 35 y.o. year old female presenting with viral URI with cough. Today this pt is afebrile nontachycardic nontachypneic, oxygenating well on room air, no wheezes rhonchi or rales. IUD contraception.  Covid and influenza PCR sent. She is out of the tamiflu window.   Promethazine DM sent.   ED return precautions discussed. Patient verbalizes understanding and agreement.    Final Clinical Impressions(s) / UC Diagnoses   Final diagnoses:  Viral URI with cough     Discharge Instructions      -Promethazine DM cough syrup for congestion/cough. This could make you drowsy, so take at night before bed. -Mucinex or similar during the day -With a virus, you're typically contagious for 5-7 days, or as long as you're having fevers.  -You can  take Tylenol up to 1000 mg 3 times daily, and ibuprofen up to 600 mg 3 times daily with food.  You can take these together, or alternate every 3-4 hours.    ED Prescriptions     Medication Sig Dispense Auth. Provider    promethazine-dextromethorphan (PROMETHAZINE-DM) 6.25-15 MG/5ML syrup Take 2.5 mLs by mouth 4 (four) times daily as needed for cough. 50 mL Hazel Sams, PA-C      PDMP not reviewed this encounter.   Hazel Sams, PA-C 03/25/21 [phone]

## 2021-03-25 NOTE — ED Triage Notes (Signed)
03/23/2021 was onset of symptoms.  Complains of cough, fatigue, sore throat, head congestion, runny nose, and muscle aches.  Reports NO fever.

## 2021-03-26 LAB — COVID-19, FLU A+B NAA
Influenza A, NAA: NOT DETECTED
Influenza B, NAA: NOT DETECTED
SARS-CoV-2, NAA: NOT DETECTED

## 2021-03-31 DIAGNOSIS — Z6830 Body mass index (BMI) 30.0-30.9, adult: Secondary | ICD-10-CM | POA: Diagnosis not present

## 2021-03-31 DIAGNOSIS — B349 Viral infection, unspecified: Secondary | ICD-10-CM | POA: Diagnosis not present

## 2021-03-31 DIAGNOSIS — E6609 Other obesity due to excess calories: Secondary | ICD-10-CM | POA: Diagnosis not present

## 2021-03-31 DIAGNOSIS — Z713 Dietary counseling and surveillance: Secondary | ICD-10-CM | POA: Diagnosis not present

## 2021-05-12 DIAGNOSIS — J029 Acute pharyngitis, unspecified: Secondary | ICD-10-CM | POA: Diagnosis not present

## 2021-05-12 DIAGNOSIS — U071 COVID-19: Secondary | ICD-10-CM | POA: Diagnosis not present

## 2021-05-12 DIAGNOSIS — J020 Streptococcal pharyngitis: Secondary | ICD-10-CM | POA: Diagnosis not present

## 2021-07-07 DIAGNOSIS — K581 Irritable bowel syndrome with constipation: Secondary | ICD-10-CM | POA: Diagnosis not present

## 2021-07-07 DIAGNOSIS — F32A Depression, unspecified: Secondary | ICD-10-CM | POA: Diagnosis not present

## 2021-07-07 DIAGNOSIS — F419 Anxiety disorder, unspecified: Secondary | ICD-10-CM | POA: Diagnosis not present

## 2021-07-07 DIAGNOSIS — F339 Major depressive disorder, recurrent, unspecified: Secondary | ICD-10-CM | POA: Diagnosis not present

## 2021-07-07 DIAGNOSIS — K219 Gastro-esophageal reflux disease without esophagitis: Secondary | ICD-10-CM | POA: Diagnosis not present

## 2021-07-07 DIAGNOSIS — Z6830 Body mass index (BMI) 30.0-30.9, adult: Secondary | ICD-10-CM | POA: Diagnosis not present

## 2021-07-07 DIAGNOSIS — E6609 Other obesity due to excess calories: Secondary | ICD-10-CM | POA: Diagnosis not present

## 2021-08-30 DIAGNOSIS — L0201 Cutaneous abscess of face: Secondary | ICD-10-CM | POA: Diagnosis not present

## 2021-12-16 DIAGNOSIS — Z20822 Contact with and (suspected) exposure to covid-19: Secondary | ICD-10-CM | POA: Diagnosis not present

## 2021-12-16 DIAGNOSIS — J069 Acute upper respiratory infection, unspecified: Secondary | ICD-10-CM | POA: Diagnosis not present

## 2021-12-16 DIAGNOSIS — J040 Acute laryngitis: Secondary | ICD-10-CM | POA: Diagnosis not present

## 2022-01-09 DIAGNOSIS — R1084 Generalized abdominal pain: Secondary | ICD-10-CM | POA: Diagnosis not present

## 2022-01-09 DIAGNOSIS — M5442 Lumbago with sciatica, left side: Secondary | ICD-10-CM | POA: Diagnosis not present

## 2022-01-10 DIAGNOSIS — M5442 Lumbago with sciatica, left side: Secondary | ICD-10-CM | POA: Diagnosis not present

## 2022-01-27 DIAGNOSIS — L0100 Impetigo, unspecified: Secondary | ICD-10-CM | POA: Diagnosis not present

## 2022-02-07 DIAGNOSIS — L0100 Impetigo, unspecified: Secondary | ICD-10-CM | POA: Diagnosis not present

## 2022-02-14 DIAGNOSIS — R21 Rash and other nonspecific skin eruption: Secondary | ICD-10-CM | POA: Diagnosis not present

## 2022-02-14 DIAGNOSIS — L0100 Impetigo, unspecified: Secondary | ICD-10-CM | POA: Diagnosis not present

## 2022-03-24 DIAGNOSIS — J988 Other specified respiratory disorders: Secondary | ICD-10-CM | POA: Diagnosis not present

## 2022-03-24 DIAGNOSIS — B9789 Other viral agents as the cause of diseases classified elsewhere: Secondary | ICD-10-CM | POA: Diagnosis not present

## 2022-03-24 DIAGNOSIS — Z20822 Contact with and (suspected) exposure to covid-19: Secondary | ICD-10-CM | POA: Diagnosis not present

## 2022-03-24 DIAGNOSIS — R6889 Other general symptoms and signs: Secondary | ICD-10-CM | POA: Diagnosis not present

## 2022-04-13 DIAGNOSIS — J069 Acute upper respiratory infection, unspecified: Secondary | ICD-10-CM | POA: Diagnosis not present

## 2022-04-13 DIAGNOSIS — Z20822 Contact with and (suspected) exposure to covid-19: Secondary | ICD-10-CM | POA: Diagnosis not present

## 2022-04-13 DIAGNOSIS — R6889 Other general symptoms and signs: Secondary | ICD-10-CM | POA: Diagnosis not present

## 2022-04-13 DIAGNOSIS — J029 Acute pharyngitis, unspecified: Secondary | ICD-10-CM | POA: Diagnosis not present

## 2022-04-14 DIAGNOSIS — R070 Pain in throat: Secondary | ICD-10-CM | POA: Diagnosis not present

## 2022-04-14 DIAGNOSIS — H60391 Other infective otitis externa, right ear: Secondary | ICD-10-CM | POA: Diagnosis not present

## 2022-04-14 DIAGNOSIS — J069 Acute upper respiratory infection, unspecified: Secondary | ICD-10-CM | POA: Diagnosis not present

## 2022-07-13 DIAGNOSIS — F418 Other specified anxiety disorders: Secondary | ICD-10-CM | POA: Diagnosis not present

## 2022-07-13 DIAGNOSIS — Z1331 Encounter for screening for depression: Secondary | ICD-10-CM | POA: Diagnosis not present

## 2022-07-13 DIAGNOSIS — E6609 Other obesity due to excess calories: Secondary | ICD-10-CM | POA: Diagnosis not present

## 2022-07-13 DIAGNOSIS — I10 Essential (primary) hypertension: Secondary | ICD-10-CM | POA: Diagnosis not present

## 2022-07-13 DIAGNOSIS — K219 Gastro-esophageal reflux disease without esophagitis: Secondary | ICD-10-CM | POA: Diagnosis not present

## 2022-07-13 DIAGNOSIS — E559 Vitamin D deficiency, unspecified: Secondary | ICD-10-CM | POA: Diagnosis not present

## 2022-07-13 DIAGNOSIS — K589 Irritable bowel syndrome without diarrhea: Secondary | ICD-10-CM | POA: Diagnosis not present

## 2022-07-13 DIAGNOSIS — Z79899 Other long term (current) drug therapy: Secondary | ICD-10-CM | POA: Diagnosis not present

## 2022-07-20 DIAGNOSIS — K589 Irritable bowel syndrome without diarrhea: Secondary | ICD-10-CM | POA: Diagnosis not present

## 2022-07-20 DIAGNOSIS — Z0001 Encounter for general adult medical examination with abnormal findings: Secondary | ICD-10-CM | POA: Diagnosis not present

## 2022-07-20 DIAGNOSIS — I10 Essential (primary) hypertension: Secondary | ICD-10-CM | POA: Diagnosis not present

## 2022-07-20 DIAGNOSIS — E782 Mixed hyperlipidemia: Secondary | ICD-10-CM | POA: Diagnosis not present

## 2022-07-20 DIAGNOSIS — F418 Other specified anxiety disorders: Secondary | ICD-10-CM | POA: Diagnosis not present

## 2022-07-20 DIAGNOSIS — K219 Gastro-esophageal reflux disease without esophagitis: Secondary | ICD-10-CM | POA: Diagnosis not present

## 2022-07-20 DIAGNOSIS — D72829 Elevated white blood cell count, unspecified: Secondary | ICD-10-CM | POA: Diagnosis not present

## 2022-07-20 DIAGNOSIS — R399 Unspecified symptoms and signs involving the genitourinary system: Secondary | ICD-10-CM | POA: Diagnosis not present

## 2022-08-05 DIAGNOSIS — K589 Irritable bowel syndrome without diarrhea: Secondary | ICD-10-CM | POA: Diagnosis not present

## 2022-08-05 DIAGNOSIS — I10 Essential (primary) hypertension: Secondary | ICD-10-CM | POA: Diagnosis not present

## 2022-08-05 DIAGNOSIS — K219 Gastro-esophageal reflux disease without esophagitis: Secondary | ICD-10-CM | POA: Diagnosis not present

## 2022-08-05 DIAGNOSIS — F418 Other specified anxiety disorders: Secondary | ICD-10-CM | POA: Diagnosis not present

## 2022-08-18 DIAGNOSIS — N6311 Unspecified lump in the right breast, upper outer quadrant: Secondary | ICD-10-CM | POA: Diagnosis not present

## 2022-08-24 DIAGNOSIS — N644 Mastodynia: Secondary | ICD-10-CM | POA: Diagnosis not present

## 2022-08-24 DIAGNOSIS — N6325 Unspecified lump in the left breast, overlapping quadrants: Secondary | ICD-10-CM | POA: Diagnosis not present

## 2022-08-24 DIAGNOSIS — N6341 Unspecified lump in right breast, subareolar: Secondary | ICD-10-CM | POA: Diagnosis not present

## 2022-08-24 DIAGNOSIS — N6322 Unspecified lump in the left breast, upper inner quadrant: Secondary | ICD-10-CM | POA: Diagnosis not present

## 2022-08-24 DIAGNOSIS — N6312 Unspecified lump in the right breast, upper inner quadrant: Secondary | ICD-10-CM | POA: Diagnosis not present

## 2022-09-09 DIAGNOSIS — Z79899 Other long term (current) drug therapy: Secondary | ICD-10-CM | POA: Diagnosis not present

## 2022-09-09 DIAGNOSIS — Z803 Family history of malignant neoplasm of breast: Secondary | ICD-10-CM | POA: Diagnosis not present

## 2022-09-09 DIAGNOSIS — I10 Essential (primary) hypertension: Secondary | ICD-10-CM | POA: Diagnosis not present

## 2022-09-09 DIAGNOSIS — R928 Other abnormal and inconclusive findings on diagnostic imaging of breast: Secondary | ICD-10-CM | POA: Diagnosis not present

## 2022-09-09 DIAGNOSIS — D6489 Other specified anemias: Secondary | ICD-10-CM | POA: Diagnosis not present

## 2022-09-09 DIAGNOSIS — N6325 Unspecified lump in the left breast, overlapping quadrants: Secondary | ICD-10-CM | POA: Diagnosis not present

## 2022-09-09 DIAGNOSIS — D242 Benign neoplasm of left breast: Secondary | ICD-10-CM | POA: Diagnosis not present

## 2022-10-01 DIAGNOSIS — Z79899 Other long term (current) drug therapy: Secondary | ICD-10-CM | POA: Diagnosis not present

## 2022-10-01 DIAGNOSIS — K219 Gastro-esophageal reflux disease without esophagitis: Secondary | ICD-10-CM | POA: Diagnosis not present

## 2022-10-01 DIAGNOSIS — E782 Mixed hyperlipidemia: Secondary | ICD-10-CM | POA: Diagnosis not present

## 2022-10-01 DIAGNOSIS — F418 Other specified anxiety disorders: Secondary | ICD-10-CM | POA: Diagnosis not present

## 2022-10-01 DIAGNOSIS — K589 Irritable bowel syndrome without diarrhea: Secondary | ICD-10-CM | POA: Diagnosis not present

## 2022-10-01 DIAGNOSIS — I10 Essential (primary) hypertension: Secondary | ICD-10-CM | POA: Diagnosis not present

## 2022-10-19 DIAGNOSIS — K219 Gastro-esophageal reflux disease without esophagitis: Secondary | ICD-10-CM | POA: Diagnosis not present

## 2022-10-19 DIAGNOSIS — I10 Essential (primary) hypertension: Secondary | ICD-10-CM | POA: Diagnosis not present

## 2022-10-19 DIAGNOSIS — E559 Vitamin D deficiency, unspecified: Secondary | ICD-10-CM | POA: Diagnosis not present

## 2022-10-19 DIAGNOSIS — Z79899 Other long term (current) drug therapy: Secondary | ICD-10-CM | POA: Diagnosis not present

## 2022-10-19 DIAGNOSIS — E782 Mixed hyperlipidemia: Secondary | ICD-10-CM | POA: Diagnosis not present

## 2022-10-26 DIAGNOSIS — E559 Vitamin D deficiency, unspecified: Secondary | ICD-10-CM | POA: Diagnosis not present

## 2022-10-26 DIAGNOSIS — R7401 Elevation of levels of liver transaminase levels: Secondary | ICD-10-CM | POA: Diagnosis not present

## 2022-10-26 DIAGNOSIS — K219 Gastro-esophageal reflux disease without esophagitis: Secondary | ICD-10-CM | POA: Diagnosis not present

## 2022-10-26 DIAGNOSIS — I10 Essential (primary) hypertension: Secondary | ICD-10-CM | POA: Diagnosis not present

## 2022-10-26 DIAGNOSIS — F418 Other specified anxiety disorders: Secondary | ICD-10-CM | POA: Diagnosis not present

## 2022-10-26 DIAGNOSIS — E782 Mixed hyperlipidemia: Secondary | ICD-10-CM | POA: Diagnosis not present

## 2022-11-03 DIAGNOSIS — H60333 Swimmer's ear, bilateral: Secondary | ICD-10-CM | POA: Diagnosis not present

## 2022-11-30 DIAGNOSIS — I10 Essential (primary) hypertension: Secondary | ICD-10-CM | POA: Diagnosis not present

## 2022-11-30 DIAGNOSIS — K219 Gastro-esophageal reflux disease without esophagitis: Secondary | ICD-10-CM | POA: Diagnosis not present

## 2022-11-30 DIAGNOSIS — F418 Other specified anxiety disorders: Secondary | ICD-10-CM | POA: Diagnosis not present

## 2022-12-31 DIAGNOSIS — H6501 Acute serous otitis media, right ear: Secondary | ICD-10-CM | POA: Diagnosis not present

## 2023-01-04 DIAGNOSIS — F418 Other specified anxiety disorders: Secondary | ICD-10-CM | POA: Diagnosis not present

## 2023-01-04 DIAGNOSIS — B3731 Acute candidiasis of vulva and vagina: Secondary | ICD-10-CM | POA: Diagnosis not present

## 2023-01-04 DIAGNOSIS — I10 Essential (primary) hypertension: Secondary | ICD-10-CM | POA: Diagnosis not present

## 2023-01-04 DIAGNOSIS — K589 Irritable bowel syndrome without diarrhea: Secondary | ICD-10-CM | POA: Diagnosis not present

## 2023-01-04 DIAGNOSIS — K219 Gastro-esophageal reflux disease without esophagitis: Secondary | ICD-10-CM | POA: Diagnosis not present

## 2023-01-04 DIAGNOSIS — E782 Mixed hyperlipidemia: Secondary | ICD-10-CM | POA: Diagnosis not present

## 2023-01-04 DIAGNOSIS — J0100 Acute maxillary sinusitis, unspecified: Secondary | ICD-10-CM | POA: Diagnosis not present

## 2023-02-24 DIAGNOSIS — N6001 Solitary cyst of right breast: Secondary | ICD-10-CM | POA: Diagnosis not present

## 2023-02-24 DIAGNOSIS — N6312 Unspecified lump in the right breast, upper inner quadrant: Secondary | ICD-10-CM | POA: Diagnosis not present

## 2023-03-01 DIAGNOSIS — K219 Gastro-esophageal reflux disease without esophagitis: Secondary | ICD-10-CM | POA: Diagnosis not present

## 2023-03-01 DIAGNOSIS — F418 Other specified anxiety disorders: Secondary | ICD-10-CM | POA: Diagnosis not present

## 2023-03-01 DIAGNOSIS — I10 Essential (primary) hypertension: Secondary | ICD-10-CM | POA: Diagnosis not present

## 2023-03-01 DIAGNOSIS — K589 Irritable bowel syndrome without diarrhea: Secondary | ICD-10-CM | POA: Diagnosis not present

## 2023-04-05 DIAGNOSIS — D72829 Elevated white blood cell count, unspecified: Secondary | ICD-10-CM | POA: Diagnosis not present

## 2023-04-05 DIAGNOSIS — I10 Essential (primary) hypertension: Secondary | ICD-10-CM | POA: Diagnosis not present

## 2023-04-05 DIAGNOSIS — E782 Mixed hyperlipidemia: Secondary | ICD-10-CM | POA: Diagnosis not present

## 2023-04-05 DIAGNOSIS — F418 Other specified anxiety disorders: Secondary | ICD-10-CM | POA: Diagnosis not present

## 2023-05-05 DIAGNOSIS — D72829 Elevated white blood cell count, unspecified: Secondary | ICD-10-CM | POA: Diagnosis not present

## 2023-05-05 DIAGNOSIS — I10 Essential (primary) hypertension: Secondary | ICD-10-CM | POA: Diagnosis not present

## 2023-05-05 DIAGNOSIS — E782 Mixed hyperlipidemia: Secondary | ICD-10-CM | POA: Diagnosis not present

## 2023-05-05 DIAGNOSIS — R7401 Elevation of levels of liver transaminase levels: Secondary | ICD-10-CM | POA: Diagnosis not present

## 2023-05-05 DIAGNOSIS — E559 Vitamin D deficiency, unspecified: Secondary | ICD-10-CM | POA: Diagnosis not present

## 2023-05-05 DIAGNOSIS — Z79899 Other long term (current) drug therapy: Secondary | ICD-10-CM | POA: Diagnosis not present
